# Patient Record
Sex: Male | Born: 1955 | Race: White | Hispanic: No | Marital: Married | Smoking: Never smoker
Health system: Southern US, Community
[De-identification: ages and names within clinical notes are randomized; demographics above are authoritative.]

## PROBLEM LIST (undated history)

## (undated) DIAGNOSIS — N200 Calculus of kidney: Secondary | ICD-10-CM

---

## 2009-11-04 ENCOUNTER — Ambulatory Visit: Payer: Self-pay | Admitting: Gastroenterology

## 2011-01-09 ENCOUNTER — Emergency Department: Payer: Self-pay | Admitting: *Deleted

## 2015-05-13 ENCOUNTER — Ambulatory Visit
Admission: EM | Admit: 2015-05-13 | Discharge: 2015-05-13 | Disposition: A | Discharge status: Home / Self Care | Payer: BLUE CROSS/BLUE SHIELD | Attending: Family Medicine | Admitting: Family Medicine

## 2015-05-13 DIAGNOSIS — J069 Acute upper respiratory infection, unspecified: Secondary | ICD-10-CM

## 2015-05-13 LAB — RAPID INFLUENZA A&B ANTIGENS (ARMC ONLY): INFLUENZA B (ARMC): NOT DETECTED

## 2015-05-13 LAB — RAPID INFLUENZA A&B ANTIGENS: Influenza A (ARMC): NOT DETECTED

## 2015-05-13 MED ORDER — GUAIFENESIN-CODEINE 100-10 MG/5ML PO SOLN: 10.0000 mL | Freq: Three times a day (TID) | ORAL | Status: DC | PRN

## 2015-05-13 NOTE — ED Notes (Signed)
Pt c/o dry hacking cough with generalized bodyaches since Monday.

## 2015-05-13 NOTE — ED Provider Notes (Signed)
[facility]  ____________________________________________  Time seen: Approximately 1:55 PM  I have reviewed the triage vital signs and the nursing notes.   HISTORY  Chief Complaint URI   HPI Jonathan Gould is a 60 y.o. male presents for complaint of runny nose, nasal congestion,sinus drainage, sinus pressure, dry cough. Patient reports the symptoms came on suddenly this past Monday. Patient reports feeling that he has had a fever at home intermittently but states did not measure with a thermometer; none today. 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 Denies other known sick contacts. Reports continues to eat and drink well. Reports cough often keeps him up at night.   Denies chest pain, shortness of breath, wheezing, weakness, abdominal pain, dysuria, neck or back pain, rash or recent sickness.   History reviewed. No pertinent past medical history.  There are no active problems to display for this patient.   History reviewed. No pertinent past surgical history.  Current Outpatient Rx  Name  Route  Sig  Dispense  Refill  . cetirizine (ZYRTEC) 10 MG tablet   Oral   Take 10 mg by mouth daily.           Allergies Review of patient's allergies indicates no known allergies.  No family history on file.  Social History Social History  Substance Use Topics  . Smoking status: Never Smoker   . Smokeless tobacco: Current User    Types: Chew  . Alcohol Use: No    Review of Systems Constitutional: Subjective fevers. Eyes: No visual changes. ENT: No sore throat. positive runny nose, nasal congestion, cough.  Cardiovascular: Denies chest pain. Respiratory: Denies shortness of breath. Gastrointestinal: No abdominal pain.  No nausea, no vomiting.  No diarrhea.  No constipation. Genitourinary: Negative for dysuria. Musculoskeletal: Negative for back pain. Skin: Negative for rash. Neurological: Negative for  headaches, focal weakness or numbness.  10-point ROS otherwise negative.  ____________________________________________   PHYSICAL EXAM:  VITAL SIGNS: ED Triage Vitals  Enc Vitals Group     BP 05/13/15 1322 136/72 mmHg     Pulse Rate 05/13/15 1322 102 Recheck 88     Resp 05/13/15 1322 19     Temp 05/13/15 1322 98.7 F (37.1 C)     Temp Source 05/13/15 1322 Oral     SpO2 05/13/15 1322 96 %     Weight 05/13/15 1322 205 lb (92.987 kg)     Height 05/13/15 1322  (1.905 m)     Head Cir --      Peak Flow --      Pain Score 05/13/15 1324 7     Pain Loc --      Pain Edu? --      Excl. in GC? --     Constitutional: Alert and oriented. Well appearing and in no acute distress. Eyes: Conjunctivae are normal. PERRL. EOMI. Head: Atraumatic.minimal tenderness to palpation bilateral frontal sinuses, nontender maxillary sinuses. No swelling. No erythema.   Ears: no erythema, normal TMs bilaterally.   Nose: nasal congestion with bilateral nasal turbinate erythema and edema. Clear rhinorrhea  Mouth/Throat: Mucous membranes are moist.  Oropharynx non-erythematous.No tonsillar swelling or exudate.  Neck: No stridor.  No cervical spine tenderness to palpation. Hematological/Lymphatic/Immunilogical: No cervical lymphadenopathy. Cardiovascular: Normal rate, regular rhythm. Grossly normal heart sounds.  Good peripheral circulation. Respiratory: Normal respiratory effort.  No retractions. Lungs CTAB. No wheezes, rales or rhonchi. Good air movement. Dry intermittent cough in room.  Gastrointestinal: Soft and nontender. No distention. Normal Bowel sounds. No CVA tenderness. Musculoskeletal: No lower or upper extremity tenderness nor edema.  Bilateral pedal pulses equal and easily palpated. No cervical, thoracic or lumbar tenderness to palpation.  Neurologic:  Normal speech and language. No gross focal neurologic deficits are appreciated. No gait instability. Skin:  Skin is warm, dry and intact. No  rash noted. Psychiatric: Mood and affect are normal. Speech and behavior are normal. ____________________________________________   LABS (all labs ordered are listed, but only abnormal results are displayed)  Labs Reviewed  RAPID INFLUENZA A&B ANTIGENS ([facility] ONLY)     INITIAL IMPRESSION / ASSESSMENT AND PLAN / ED COURSE  Pertinent labs & imaging results that were available during my care of the patient were reviewed by me and considered in my medical decision making (see chart for details).  Very well-appearing patient. No acute distress. Presents with complaints of 3 days of runny nose, nasal congestion, sinus drainage, sinus pressure, cough. Denies chest pain or shortness of breath. Reports continues to eat and drink well. Lungs clear throughout. Abdomen soft and nontender. Moist mucous membranes. Suspect viral upper respiratory infection/influenza. Will swab for influenza.   Influenza negative. Suspect viral upper respiratory infection. Will treat supportively and symptomatically. Encouraged rest, fluids, when necessary guaifenesin with codeine, over-the-counter Zyrtec as needed. Encouraged close PCP follow-up.   Discussed follow up with Primary care physician this week. Discussed follow up and return parameters including chest pain, shortness of breath, weakness, dizziness, fevers, abdominal pain, no resolution or any worsening concerns. Patient verbalized understanding and agreed to plan.   ____________________________________________   FINAL CLINICAL IMPRESSION(S) / ED DIAGNOSES  Final diagnoses:  Viral upper respiratory infection      Note: This dictation was prepared with Dragon dictation along with smaller phrase technology. Any transcriptional errors that result from this process are unintentional.    Renford Dills, NP 05/13/15 1526  Renford Dills, NP 05/13/15 1528

## 2015-05-13 NOTE — Discharge Instructions (Signed)
Take medication as prescribed. Rest. Eat and drink regularly.  Follow-up closely with your primary care physician this week. Return to urgent care proceed to ER for chest pain, shortness of breath, fevers, abdominal pain, new or worsening concerns.  Upper Respiratory Infection, Adult Most upper respiratory infections (URIs) are a viral infection of the air passages leading to the lungs. A URI affects the nose, throat, and upper air passages. The most common type of URI is nasopharyngitis and is typically referred to as "the common cold." URIs run their course and usually go away on their own. Most of the time, a URI does not require medical attention, but sometimes a bacterial infection in the upper airways can follow a viral infection. This is called a secondary infection. Sinus and middle ear infections are common types of secondary upper respiratory infections. Bacterial pneumonia can also complicate a URI. A URI can worsen asthma and chronic obstructive pulmonary disease (COPD). Sometimes, these complications can require emergency medical care and may be life threatening.  CAUSES Almost all URIs are caused by viruses. A virus is a type of germ and can spread from one person to another.  RISKS FACTORS You may be at risk for a URI if:   You smoke.   You have chronic heart or lung disease.  You have a weakened defense (immune) system.   You are very young or very old.   You have nasal allergies or asthma.  You work in crowded or poorly ventilated areas.  You work in health care facilities or schools. SIGNS AND SYMPTOMS  Symptoms typically develop 2-3 days after you come in contact with a cold virus. Most viral URIs last 7-10 days. However, viral URIs from the influenza virus (flu virus) can last 14-18 days and are typically more severe. Symptoms may include:   Runny or stuffy (congested) nose.   Sneezing.   Cough.   Sore throat.   Headache.   Fatigue.   Fever.    Loss of appetite.   Pain in your forehead, behind your eyes, and over your cheekbones (sinus pain).  Muscle aches.  DIAGNOSIS  Your health care provider may diagnose a URI by:  Physical exam.  Tests to check that your symptoms are not due to another condition such as:  Strep throat.  Sinusitis.  Pneumonia.  Asthma. TREATMENT  A URI goes away on its own with time. It cannot be cured with medicines, but medicines may be prescribed or recommended to relieve symptoms. Medicines may help:  Reduce your fever.  Reduce your cough.  Relieve nasal congestion. HOME CARE INSTRUCTIONS   Take medicines only as directed by your health care provider.   Gargle warm saltwater or take cough drops to comfort your throat as directed by your health care provider.  Use a warm mist humidifier or inhale steam from a shower to increase air moisture. This may make it easier to breathe.  Drink enough fluid to keep your urine clear or pale yellow.   Eat soups and other clear broths and maintain good nutrition.   Rest as needed.   Return to work when your temperature has returned to normal or as your health care provider advises. You may need to stay home longer to avoid infecting others. You can also use a face mask and careful hand washing to prevent spread of the virus.  Increase the usage of your inhaler if you have asthma.   Do not use any tobacco products, including cigarettes, chewing tobacco, or  electronic cigarettes. If you need help quitting, ask your health care provider. PREVENTION  The best way to protect yourself from getting a cold is to practice good hygiene.   Avoid oral or hand contact with people with cold symptoms.   Wash your hands often if contact occurs.  There is no clear evidence that vitamin C, vitamin E, echinacea, or exercise reduces the chance of developing a cold. However, it is always recommended to get plenty of rest, exercise, and practice good  nutrition.  SEEK MEDICAL CARE IF:   You are getting worse rather than better.   Your symptoms are not controlled by medicine.   You have chills.  You have worsening shortness of breath.  You have brown or red mucus.  You have yellow or brown nasal discharge.  You have pain in your face, especially when you bend forward.  You have a fever.  You have swollen neck glands.  You have pain while swallowing.  You have white areas in the back of your throat. SEEK IMMEDIATE MEDICAL CARE IF:   You have severe or persistent:  Headache.  Ear pain.  Sinus pain.  Chest pain.  You have chronic lung disease and any of the following:  Wheezing.  Prolonged cough.  Coughing up blood.  A change in your usual mucus.  You have a stiff neck.  You have changes in your:  Vision.  Hearing.  Thinking.  Mood. MAKE SURE YOU:   Understand these instructions.  Will watch your condition.  Will get help right away if you are not doing well or get worse.   This information is not intended to replace advice given to you by your health care provider. Make sure you discuss any questions you have with your health care provider.   Document Released: 09/27/2000 Document Revised: 08/18/2014 Document Reviewed: 07/09/2013 Elsevier Interactive Patient Education [employer].

## 2019-11-03 ENCOUNTER — Encounter: Payer: Self-pay | Admitting: Emergency Medicine

## 2019-11-03 ENCOUNTER — Other Ambulatory Visit: Payer: Self-pay

## 2019-11-03 ENCOUNTER — Ambulatory Visit
Admission: EM | Admit: 2019-11-03 | Discharge: 2019-11-03 | Disposition: A | Discharge status: Home / Self Care | Payer: BC Managed Care – PPO | Attending: Family Medicine | Admitting: Family Medicine

## 2019-11-03 DIAGNOSIS — R490 Dysphonia: Secondary | ICD-10-CM | POA: Diagnosis not present

## 2019-11-03 DIAGNOSIS — J988 Other specified respiratory disorders: Secondary | ICD-10-CM | POA: Diagnosis not present

## 2019-11-03 DIAGNOSIS — Z79899 Other long term (current) drug therapy: Secondary | ICD-10-CM | POA: Insufficient documentation

## 2019-11-03 DIAGNOSIS — J029 Acute pharyngitis, unspecified: Secondary | ICD-10-CM | POA: Diagnosis not present

## 2019-11-03 DIAGNOSIS — R5383 Other fatigue: Secondary | ICD-10-CM | POA: Insufficient documentation

## 2019-11-03 DIAGNOSIS — Z20822 Contact with and (suspected) exposure to covid-19: Secondary | ICD-10-CM | POA: Insufficient documentation

## 2019-11-03 DIAGNOSIS — R05 Cough: Secondary | ICD-10-CM | POA: Insufficient documentation

## 2019-11-03 MED ORDER — IPRATROPIUM BROMIDE 0.06 % NA SOLN: 2.0000 | Freq: Four times a day (QID) | NASAL | 0 refills | Status: DC | PRN

## 2019-11-03 MED ORDER — BENZONATATE 200 MG PO CAPS: 200.0000 mg | ORAL_CAPSULE | Freq: Three times a day (TID) | ORAL | 0 refills | Status: DC | PRN

## 2019-11-03 NOTE — ED Provider Notes (Signed)
[facility]    CSN: [id number] Arrival date & time: 11/03/19  1714      History   Chief Complaint Chief Complaint  Patient presents with   Cough   HPI  64 year old male presents with respiratory symptoms.  Patient reports that his symptoms started approximately 3 days ago.  Started after he came home from work.  No reported sick contacts.  Reports cough, congestion, sore throat, hoarseness, fatigue.  No documented fever.  Denies shortness of breath.  No GI symptoms.  He has not been vaccinated against COVID-19.  No relieving factors.  Denies pain at this time.  No other complaints.   Home Medications    Prior to Admission medications   Medication Sig Start Date End Date Taking? Authorizing Provider  cetirizine (ZYRTEC) 10 MG tablet Take 10 mg by mouth daily.   Yes [provider]  benzonatate (TESSALON) 200 MG capsule Take 1 capsule (200 mg total) by mouth 3 (three) times daily as needed for cough. 11/03/19   Tommie Sams, DO  guaiFENesin-codeine 100-10 MG/5ML syrup Take 10 mLs by mouth 3 (three) times daily as needed for cough (do not drive or operate machinery while taking as can cause drowsiness.). 05/13/15   Renford Dills, NP  ipratropium (ATROVENT) 0.06 % nasal spray Place 2 sprays into both nostrils 4 (four) times daily as needed for rhinitis. 11/03/19   Tommie Sams, DO   Social History Social History   Tobacco Use   Smoking status: Never Smoker   Smokeless tobacco: Current User    Types: Chew  Vaping Use   Vaping Use: Never used  Substance Use Topics   Alcohol use: No   Drug use: Not Currently     Allergies   Patient has no known allergies.   Review of Systems Review of Systems Per HPI  Physical Exam Triage Vital Signs ED Triage Vitals  Enc Vitals Group     BP 11/03/19 1745 136/90     Pulse Rate 11/03/19 1745 79     Resp 11/03/19 1745 18     Temp 11/03/19 1745 99.5 F (37.5 C)     Temp Source 11/03/19 1745 Oral      SpO2 11/03/19 1745 97 %     Weight 11/03/19 1744 205 lb 0.4 oz (93 kg)     Height 11/03/19 1744 6\' 3"  (1.905 m)     Head Circumference --      Peak Flow --      Pain Score 11/03/19 1744 0     Pain Loc --      Pain Edu? --      Excl. in GC? --    Updated Vital Signs BP 136/90 (BP Location: Left Arm)    Pulse 79    Temp 99.5 F (37.5 C) (Oral)    Resp 18    Ht 6\' 3"  (1.905 m)    Wt 93 kg    SpO2 97%    BMI 25.63 kg/m   Visual Acuity Right Eye Distance:   Left Eye Distance:   Bilateral Distance:    Right Eye Near:   Left Eye Near:    Bilateral Near:     Physical Exam Vitals and nursing note reviewed.  Constitutional:      General: He is not in acute distress.    Appearance: Normal appearance. He is not ill-appearing.  HENT:     Head: Normocephalic and atraumatic.     Right Ear: Tympanic membrane normal.  Left Ear: Tympanic membrane normal.     Mouth/Throat:     Pharynx: Oropharynx is clear. No oropharyngeal exudate.  Eyes:     General:        Right eye: No discharge.        Left eye: No discharge.     Conjunctiva/sclera: Conjunctivae normal.  Cardiovascular:     Rate and Rhythm: Normal rate and regular rhythm.     Heart sounds: No murmur heard.   Pulmonary:     Effort: Pulmonary effort is normal.     Breath sounds: No wheezing, rhonchi or rales.  Neurological:     Mental Status: He is alert.  Psychiatric:        Mood and Affect: Mood normal.        Behavior: Behavior normal.    UC Treatments / Results  Labs (all labs ordered are listed, but only abnormal results are displayed) Labs Reviewed  SARS CORONAVIRUS 2 (TAT 6-24 HRS)    EKG   Radiology No results found.  Procedures Procedures (including critical care time)  Medications Ordered in UC Medications - No data to display  Initial Impression / Assessment and Plan / UC Course  I have reviewed the triage vital signs and the nursing notes.  Pertinent labs & imaging results that were available  during my care of the patient were reviewed by me and considered in my medical decision making (see chart for details).    64 year old male presents with a respiratory infection.  Concern for COVID-19.  Tessalon Perles, Atrovent nasal spray for symptomatic treatment.  Awaiting Covid test results.  Final Clinical Impressions(s) / UC Diagnoses   Final diagnoses:  Respiratory infection     Discharge Instructions     Tylenol as needed for fever/body aches.  Medications as prescribed.  COVID test result should be back tomorrow.  Take care  Dr. Adriana Simas    ED Prescriptions    Medication Sig Dispense Auth. Provider   benzonatate (TESSALON) 200 MG capsule Take 1 capsule (200 mg total) by mouth 3 (three) times daily as needed for cough. 30 capsule Rihana Kiddy G, DO   ipratropium (ATROVENT) 0.06 % nasal spray Place 2 sprays into both nostrils 4 (four) times daily as needed for rhinitis. 15 mL Tommie Sams, DO     PDMP not reviewed this encounter.   Tommie Sams, [place] 11/03/19 2034

## 2019-11-03 NOTE — Discharge Instructions (Signed)
Tylenol as needed for fever/body aches.  Medications as prescribed.  COVID test result should be back tomorrow.  Take care  Dr. Adriana Simas

## 2019-11-03 NOTE — ED Triage Notes (Signed)
Pt c/o cough, sore throat, sinus congestion, hoarseness, and fatigue. Started about 3 days ago. Denies fever, shortness of breath or GI symptoms. Has not had covid vaccines.

## 2019-11-04 LAB — SARS CORONAVIRUS 2 (TAT 6-24 HRS): SARS Coronavirus 2: NEGATIVE

## 2020-07-16 DIAGNOSIS — Z98811 Dental restoration status: Secondary | ICD-10-CM

## 2020-07-16 HISTORY — DX: Dental restoration status: Z98.811

## 2020-07-24 ENCOUNTER — Encounter: Payer: Self-pay | Admitting: Intensive Care

## 2020-07-24 ENCOUNTER — Observation Stay: Payer: BC Managed Care – PPO | Admitting: Anesthesiology

## 2020-07-24 ENCOUNTER — Encounter
Admission: EM | Disposition: A | Discharge status: Home / Self Care | Payer: Self-pay | Source: Home / Self Care | Attending: Emergency Medicine

## 2020-07-24 ENCOUNTER — Emergency Department: Payer: BC Managed Care – PPO

## 2020-07-24 ENCOUNTER — Ambulatory Visit
Admission: EM | Admit: 2020-07-24 | Discharge: 2020-07-24 | Disposition: A | Discharge status: Home / Self Care | Payer: BC Managed Care – PPO | Source: Home / Self Care

## 2020-07-24 ENCOUNTER — Other Ambulatory Visit: Payer: Self-pay

## 2020-07-24 ENCOUNTER — Ambulatory Visit (INDEPENDENT_AMBULATORY_CARE_PROVIDER_SITE_OTHER): Payer: BC Managed Care – PPO

## 2020-07-24 ENCOUNTER — Observation Stay
Admission: EM | Admit: 2020-07-24 | Discharge: 2020-07-26 | Disposition: A | Discharge status: Home / Self Care | Payer: BC Managed Care – PPO | Attending: Urology | Admitting: Urology

## 2020-07-24 DIAGNOSIS — N12 Tubulo-interstitial nephritis, not specified as acute or chronic: Secondary | ICD-10-CM | POA: Insufficient documentation

## 2020-07-24 DIAGNOSIS — N132 Hydronephrosis with renal and ureteral calculous obstruction: Secondary | ICD-10-CM | POA: Diagnosis not present

## 2020-07-24 DIAGNOSIS — N1330 Unspecified hydronephrosis: Secondary | ICD-10-CM

## 2020-07-24 DIAGNOSIS — R509 Fever, unspecified: Secondary | ICD-10-CM

## 2020-07-24 DIAGNOSIS — N201 Calculus of ureter: Secondary | ICD-10-CM | POA: Diagnosis not present

## 2020-07-24 DIAGNOSIS — N10 Acute tubulo-interstitial nephritis: Secondary | ICD-10-CM | POA: Insufficient documentation

## 2020-07-24 DIAGNOSIS — Z20822 Contact with and (suspected) exposure to covid-19: Secondary | ICD-10-CM | POA: Diagnosis not present

## 2020-07-24 DIAGNOSIS — R1030 Lower abdominal pain, unspecified: Secondary | ICD-10-CM | POA: Diagnosis present

## 2020-07-24 DIAGNOSIS — N200 Calculus of kidney: Secondary | ICD-10-CM

## 2020-07-24 DIAGNOSIS — N135 Crossing vessel and stricture of ureter without hydronephrosis: Secondary | ICD-10-CM

## 2020-07-24 DIAGNOSIS — R109 Unspecified abdominal pain: Secondary | ICD-10-CM

## 2020-07-24 DIAGNOSIS — N179 Acute kidney failure, unspecified: Secondary | ICD-10-CM | POA: Insufficient documentation

## 2020-07-24 DIAGNOSIS — Z79899 Other long term (current) drug therapy: Secondary | ICD-10-CM | POA: Diagnosis not present

## 2020-07-24 HISTORY — PX: CYSTOSCOPY WITH RETROGRADE PYELOGRAM, URETEROSCOPY AND STENT PLACEMENT: SHX5789

## 2020-07-24 HISTORY — DX: Calculus of kidney: N20.0

## 2020-07-24 LAB — URINALYSIS, COMPLETE (UACMP) WITH MICROSCOPIC
Bilirubin Urine: NEGATIVE
Glucose, UA: NEGATIVE mg/dL
Ketones, ur: 40 mg/dL — AB
Nitrite: POSITIVE — AB
Protein, ur: NEGATIVE mg/dL
Specific Gravity, Urine: 1.015 (ref 1.005–1.030)
WBC, UA: >50 WBC/hpf (ref 0–5)
pH: 5.5 (ref 5.0–8.0)

## 2020-07-24 LAB — CBC WITH DIFFERENTIAL/PLATELET
Abs Immature Granulocytes: 0.04 10*3/uL (ref 0.00–0.07)
Basophils Absolute: 0 10*3/uL (ref 0.0–0.1)
Basophils Relative: 0 %
Eosinophils Absolute: 0 10*3/uL (ref 0.0–0.5)
Eosinophils Relative: 0 %
HCT: 42.1 % (ref 39.0–52.0)
Hemoglobin: 14.3 g/dL (ref 13.0–17.0)
Immature Granulocytes: 0 %
Lymphocytes Relative: 4 %
Lymphs Abs: 0.5 10*3/uL — ABNORMAL LOW (ref 0.7–4.0)
MCH: 30.9 pg (ref 26.0–34.0)
MCHC: 34 g/dL (ref 30.0–36.0)
MCV: 90.9 fL (ref 80.0–100.0)
Monocytes Absolute: 0.3 10*3/uL (ref 0.1–1.0)
Monocytes Relative: 3 %
Neutro Abs: 11 10*3/uL — ABNORMAL HIGH (ref 1.7–7.7)
Neutrophils Relative %: 93 %
Platelets: 188 10*3/uL (ref 150–400)
RBC: 4.63 MIL/uL (ref 4.22–5.81)
RDW: 13.9 % (ref 11.5–15.5)
WBC: 11.9 10*3/uL — ABNORMAL HIGH (ref 4.0–10.5)
nRBC: 0 % (ref 0.0–0.2)

## 2020-07-24 LAB — CBG MONITORING, ED: Glucose-Capillary: 86 mg/dL (ref 70–99)

## 2020-07-24 LAB — CBC
HCT: 40 % (ref 39.0–52.0)
Hemoglobin: 13.5 g/dL (ref 13.0–17.0)
MCH: 31 pg (ref 26.0–34.0)
MCHC: 33.8 g/dL (ref 30.0–36.0)
MCV: 92 fL (ref 80.0–100.0)
Platelets: 173 10*3/uL (ref 150–400)
RBC: 4.35 MIL/uL (ref 4.22–5.81)
RDW: 13.9 % (ref 11.5–15.5)
WBC: 14.4 10*3/uL — ABNORMAL HIGH (ref 4.0–10.5)
nRBC: 0 % (ref 0.0–0.2)

## 2020-07-24 LAB — RESP PANEL BY RT-PCR (FLU A&B, COVID) ARPGX2
Influenza A by PCR: NEGATIVE
Influenza B by PCR: NEGATIVE
SARS Coronavirus 2 by RT PCR: NEGATIVE

## 2020-07-24 LAB — COMPREHENSIVE METABOLIC PANEL
ALT: 26 U/L (ref 0–44)
AST: 30 U/L (ref 15–41)
Albumin: 4.1 g/dL (ref 3.5–5.0)
Alkaline Phosphatase: 72 U/L (ref 38–126)
Anion gap: 8 (ref 5–15)
BUN: 15 mg/dL (ref 8–23)
CO2: 24 mmol/L (ref 22–32)
Calcium: 9 mg/dL (ref 8.9–10.3)
Chloride: 102 mmol/L (ref 98–111)
Creatinine, Ser: 1.53 mg/dL — ABNORMAL HIGH (ref 0.61–1.24)
GFR, Estimated: 51 mL/min — ABNORMAL LOW (ref >60)
Glucose, Bld: 110 mg/dL — ABNORMAL HIGH (ref 70–99)
Potassium: 4.1 mmol/L (ref 3.5–5.1)
Sodium: 134 mmol/L — ABNORMAL LOW (ref 135–145)
Total Bilirubin: 1.2 mg/dL (ref 0.3–1.2)
Total Protein: 7.2 g/dL (ref 6.5–8.1)

## 2020-07-24 LAB — CREATININE, SERUM
Creatinine, Ser: 1.65 mg/dL — ABNORMAL HIGH (ref 0.61–1.24)
GFR, Estimated: 46 mL/min — ABNORMAL LOW (ref >60)

## 2020-07-24 SURGERY — CYSTOURETEROSCOPY, WITH RETROGRADE PYELOGRAM AND STENT INSERTION
Anesthesia: General | Laterality: Left

## 2020-07-24 MED ORDER — FENTANYL CITRATE (PF) 100 MCG/2ML IJ SOLN
INTRAMUSCULAR | Status: AC
  Filled 2020-07-24: qty 2

## 2020-07-24 MED ORDER — OXYCODONE HCL 5 MG PO TABS: 5.0000 mg | ORAL_TABLET | Freq: Once | ORAL | Status: DC | PRN

## 2020-07-24 MED ORDER — DEXAMETHASONE SODIUM PHOSPHATE 10 MG/ML IJ SOLN
INTRAMUSCULAR | Status: DC | PRN
  Administered 2020-07-24: 10 mg via INTRAVENOUS

## 2020-07-24 MED ORDER — SODIUM CHLORIDE 0.9 % IV SOLN: INTRAVENOUS | Status: DC

## 2020-07-24 MED ORDER — DEXAMETHASONE SODIUM PHOSPHATE 10 MG/ML IJ SOLN
INTRAMUSCULAR | Status: AC
  Filled 2020-07-24: qty 1

## 2020-07-24 MED ORDER — SODIUM CHLORIDE 0.9 % IV SOLN
2.0000 g | Freq: Once | INTRAVENOUS | Status: AC
  Administered 2020-07-24: 2 g via INTRAVENOUS
  Filled 2020-07-24: qty 20

## 2020-07-24 MED ORDER — BENZONATATE 100 MG PO CAPS
200.0000 mg | ORAL_CAPSULE | Freq: Three times a day (TID) | ORAL | Status: DC | PRN
  Filled 2020-07-24: qty 2

## 2020-07-24 MED ORDER — ONDANSETRON HCL 4 MG/2ML IJ SOLN
4.0000 mg | Freq: Once | INTRAMUSCULAR | Status: AC
  Administered 2020-07-24: 4 mg via INTRAVENOUS
  Filled 2020-07-24: qty 2

## 2020-07-24 MED ORDER — KETOROLAC TROMETHAMINE 30 MG/ML IJ SOLN
15.0000 mg | INTRAMUSCULAR | Status: AC
  Administered 2020-07-24: 15 mg via INTRAVENOUS
  Filled 2020-07-24: qty 1

## 2020-07-24 MED ORDER — FENTANYL CITRATE (PF) 100 MCG/2ML IJ SOLN
INTRAMUSCULAR | Status: DC | PRN
  Administered 2020-07-24 (×2): 50 ug via INTRAVENOUS

## 2020-07-24 MED ORDER — OXYCODONE HCL 5 MG/5ML PO SOLN: 5.0000 mg | Freq: Once | ORAL | Status: DC | PRN

## 2020-07-24 MED ORDER — LIDOCAINE HCL (CARDIAC) PF 100 MG/5ML IV SOSY
PREFILLED_SYRINGE | INTRAVENOUS | Status: DC | PRN
  Administered 2020-07-24: 100 mg via INTRAVENOUS

## 2020-07-24 MED ORDER — ONDANSETRON HCL 4 MG/2ML IJ SOLN
INTRAMUSCULAR | Status: AC
  Filled 2020-07-24: qty 2

## 2020-07-24 MED ORDER — ONDANSETRON HCL 4 MG/2ML IJ SOLN: 4.0000 mg | Freq: Once | INTRAMUSCULAR | Status: DC | PRN

## 2020-07-24 MED ORDER — ZOLPIDEM TARTRATE 5 MG PO TABS: 5.0000 mg | ORAL_TABLET | Freq: Every evening | ORAL | Status: DC | PRN

## 2020-07-24 MED ORDER — SODIUM CHLORIDE 0.9 % IV BOLUS
1000.0000 mL | Freq: Once | INTRAVENOUS | Status: AC
  Administered 2020-07-24: 1000 mL via INTRAVENOUS

## 2020-07-24 MED ORDER — DEXTROSE 5 % IV SOLN
5.0000 mg/kg | Freq: Once | INTRAVENOUS | Status: AC
  Administered 2020-07-24: 480 mg via INTRAVENOUS
  Filled 2020-07-24: qty 12

## 2020-07-24 MED ORDER — CIPROFLOXACIN HCL 500 MG PO TABS: 500.0000 mg | ORAL_TABLET | Freq: Two times a day (BID) | ORAL | 0 refills | Status: DC

## 2020-07-24 MED ORDER — SODIUM CHLORIDE 0.9 % IV SOLN
1.0000 g | INTRAVENOUS | Status: DC
  Administered 2020-07-25 – 2020-07-26 (×2): 1 g via INTRAVENOUS
  Filled 2020-07-24 (×2): qty 1

## 2020-07-24 MED ORDER — ACETAMINOPHEN 10 MG/ML IV SOLN
1000.0000 mg | Freq: Once | INTRAVENOUS | Status: DC | PRN
  Administered 2020-07-24: 1000 mg via INTRAVENOUS

## 2020-07-24 MED ORDER — OXYCODONE-ACETAMINOPHEN 5-325 MG PO TABS
1.0000 | ORAL_TABLET | ORAL | Status: DC | PRN
  Administered 2020-07-24 (×2): 1 via ORAL
  Administered 2020-07-25 (×2): 2 via ORAL
  Filled 2020-07-24: qty 1
  Filled 2020-07-24 (×2): qty 2
  Filled 2020-07-24: qty 1

## 2020-07-24 MED ORDER — PROPOFOL 10 MG/ML IV BOLUS
INTRAVENOUS | Status: DC | PRN
  Administered 2020-07-24: 160 mg via INTRAVENOUS
  Administered 2020-07-24: 40 mg via INTRAVENOUS

## 2020-07-24 MED ORDER — MIDAZOLAM HCL 2 MG/2ML IJ SOLN
INTRAMUSCULAR | Status: AC
  Filled 2020-07-24: qty 2

## 2020-07-24 MED ORDER — IPRATROPIUM BROMIDE 0.06 % NA SOLN
2.0000 | Freq: Four times a day (QID) | NASAL | Status: DC | PRN
  Filled 2020-07-24: qty 15

## 2020-07-24 MED ORDER — LACTATED RINGERS IV SOLN: INTRAVENOUS | Status: DC | PRN

## 2020-07-24 MED ORDER — DOCUSATE SODIUM 100 MG PO CAPS: 100.0000 mg | ORAL_CAPSULE | Freq: Every day | ORAL | 0 refills | Status: AC

## 2020-07-24 MED ORDER — ACETAMINOPHEN 325 MG PO TABS
650.0000 mg | ORAL_TABLET | ORAL | Status: DC | PRN
  Administered 2020-07-26: 650 mg via ORAL
  Filled 2020-07-24: qty 2

## 2020-07-24 MED ORDER — PROPOFOL 500 MG/50ML IV EMUL
INTRAVENOUS | Status: AC
  Filled 2020-07-24: qty 50

## 2020-07-24 MED ORDER — POLYETHYLENE GLYCOL 3350 17 G PO PACK
17.0000 g | PACK | Freq: Every day | ORAL | Status: DC | PRN
  Filled 2020-07-24: qty 1

## 2020-07-24 MED ORDER — ACETAMINOPHEN 10 MG/ML IV SOLN
INTRAVENOUS | Status: AC
  Filled 2020-07-24: qty 100

## 2020-07-24 MED ORDER — PHENYLEPHRINE HCL (PRESSORS) 10 MG/ML IV SOLN
INTRAVENOUS | Status: DC | PRN
  Administered 2020-07-24: 200 ug via INTRAVENOUS
  Administered 2020-07-24: 100 ug via INTRAVENOUS

## 2020-07-24 MED ORDER — DIPHENHYDRAMINE HCL 12.5 MG/5ML PO ELIX
12.5000 mg | ORAL_SOLUTION | Freq: Four times a day (QID) | ORAL | Status: DC | PRN
  Filled 2020-07-24: qty 10

## 2020-07-24 MED ORDER — DIPHENHYDRAMINE HCL 50 MG/ML IJ SOLN: 12.5000 mg | Freq: Four times a day (QID) | INTRAMUSCULAR | Status: DC | PRN

## 2020-07-24 MED ORDER — HEPARIN SODIUM (PORCINE) 5000 UNIT/ML IJ SOLN
5000.0000 [IU] | Freq: Three times a day (TID) | INTRAMUSCULAR | Status: DC
  Administered 2020-07-24 – 2020-07-26 (×5): 5000 [IU] via SUBCUTANEOUS
  Filled 2020-07-24 (×5): qty 1

## 2020-07-24 MED ORDER — SUCCINYLCHOLINE CHLORIDE 20 MG/ML IJ SOLN
INTRAMUSCULAR | Status: DC | PRN
  Administered 2020-07-24: 120 mg via INTRAVENOUS

## 2020-07-24 MED ORDER — HYDROMORPHONE HCL 1 MG/ML IJ SOLN: 0.5000 mg | INTRAMUSCULAR | Status: DC | PRN

## 2020-07-24 MED ORDER — LORATADINE 10 MG PO TABS
10.0000 mg | ORAL_TABLET | Freq: Every day | ORAL | Status: DC
  Administered 2020-07-25: 10 mg via ORAL
  Filled 2020-07-24 (×3): qty 1

## 2020-07-24 MED ORDER — OXYCODONE-ACETAMINOPHEN 5-325 MG PO TABS: 1.0000 | ORAL_TABLET | ORAL | 0 refills | Status: DC | PRN

## 2020-07-24 MED ORDER — GUAIFENESIN-CODEINE 100-10 MG/5ML PO SOLN: 10.0000 mL | Freq: Three times a day (TID) | ORAL | Status: DC | PRN

## 2020-07-24 MED ORDER — SUGAMMADEX SODIUM 200 MG/2ML IV SOLN
INTRAVENOUS | Status: DC | PRN
  Administered 2020-07-24: 400 mg via INTRAVENOUS

## 2020-07-24 MED ORDER — ONDANSETRON HCL 4 MG/2ML IJ SOLN
INTRAMUSCULAR | Status: DC | PRN
  Administered 2020-07-24: 4 mg via INTRAVENOUS

## 2020-07-24 MED ORDER — ROCURONIUM BROMIDE 100 MG/10ML IV SOLN
INTRAVENOUS | Status: DC | PRN
  Administered 2020-07-24: 15 mg via INTRAVENOUS

## 2020-07-24 MED ORDER — SODIUM CHLORIDE FLUSH 0.9 % IV SOLN
INTRAVENOUS | Status: AC
  Filled 2020-07-24: qty 3

## 2020-07-24 MED ORDER — ONDANSETRON HCL 4 MG/2ML IJ SOLN: 4.0000 mg | INTRAMUSCULAR | Status: DC | PRN

## 2020-07-24 MED ORDER — FENTANYL CITRATE (PF) 100 MCG/2ML IJ SOLN: 25.0000 ug | INTRAMUSCULAR | Status: DC | PRN

## 2020-07-24 SURGICAL SUPPLY — 19 items
BAG URO DRAIN 4000ML (MISCELLANEOUS) ×2 IMPLANT
EXTRACTOR STONE NITINOL NGAGE (UROLOGICAL SUPPLIES) IMPLANT
GLOVE SURG ENC TEXT LTX SZ7 (GLOVE) ×2 IMPLANT
GOWN STRL REUS W/ TWL LRG LVL3 (GOWN DISPOSABLE) ×1 IMPLANT
GOWN STRL REUS W/TWL LRG LVL3 (GOWN DISPOSABLE) ×1
GUIDEWIRE SENSOR ANG DUAL FLEX (WIRE) ×2 IMPLANT
GUIDEWIRE STR DUAL SENSOR (WIRE) ×4 IMPLANT
IV NS 1000ML (IV SOLUTION) ×1
IV NS 1000ML BAXH (IV SOLUTION) ×1 IMPLANT
KIT TURNOVER KIT A (KITS) ×2 IMPLANT
LASER FIB FLEXIVA PULSE ID 365 (Laser) IMPLANT
MANIFOLD NEPTUNE II (INSTRUMENTS) ×2 IMPLANT
PACK CYSTO (CUSTOM PROCEDURE TRAY) ×2 IMPLANT
SET CYSTO W/LG BORE CLAMP LF (SET/KITS/TRAYS/PACK) ×2 IMPLANT
SHEATH URETERAL 12FRX35CM (MISCELLANEOUS) IMPLANT
STENT URET 6FRX28 CONTOUR (STENTS) ×2 IMPLANT
TRACTIP FLEXIVA PULS ID 200XHI (Laser) IMPLANT
TRACTIP FLEXIVA PULSE ID 200 (Laser)
TUBING CONNECTING 10 (TUBING) ×2 IMPLANT

## 2020-07-24 NOTE — ED Triage Notes (Signed)
Pt c/o possible kidney stone. Pt reports left-sided flank pain, fever (103.8, did take tylenol), chills. Pt denies hematuria or other urinary problems. Pt does have hx of kidney stones in same kidney.

## 2020-07-24 NOTE — Transfer of Care (Signed)
Immediate Anesthesia Transfer of Care Note  Patient: Jonathan Gould  Procedure(s) Performed: CYSTOSCOPY WITH RETROGRADE PYELOGRAM, URETEROSCOPY AND STENT PLACEMENT (Left )  Patient Location: PACU  Anesthesia Type:General  Level of Consciousness: sedated  Airway & Oxygen Therapy: Patient Spontanous Breathing and Patient connected to face mask oxygen  Post-op Assessment: Report given to RN and Post -op Vital signs reviewed and stable  Post vital signs: Reviewed and stable  Last Vitals:  Vitals Value Taken Time  BP    Temp 36.7 C 07/24/20 2040  Pulse 100 07/24/20 2042  Resp 16 07/24/20 2042  SpO2 99 % 07/24/20 2042  Vitals shown include unvalidated device data.  Last Pain:  Vitals:   07/24/20 2040  TempSrc:   PainSc: 0-No pain         Complications: No complications documented.

## 2020-07-24 NOTE — Anesthesia Procedure Notes (Signed)
Procedure Name: Intubation Date/Time: 07/24/2020 8:03 PM Performed by: Justus Memory, CRNA Pre-anesthesia Checklist: Patient identified, Patient being monitored, Timeout performed, Emergency Drugs available and Suction available Patient Re-evaluated:Patient Re-evaluated prior to induction Oxygen Delivery Method: Circle system utilized Preoxygenation: Pre-oxygenation with 100% oxygen Induction Type: IV induction Ventilation: Mask ventilation without difficulty Laryngoscope Size: Mac, 3 and McGraph Grade View: Grade I Tube type: Oral Tube size: 7.0 mm Number of attempts: 1 Airway Equipment and Method: Stylet and Video-laryngoscopy Placement Confirmation: ETT inserted through vocal cords under direct vision,  positive ETCO2 and breath sounds checked- equal and bilateral Secured at: 21 cm Tube secured with: Tape Dental Injury: Teeth and Oropharynx as per pre-operative assessment  Difficulty Due To: Difficulty was anticipated and Difficult Airway- due to anterior larynx Future Recommendations: Recommend- induction with short-acting agent, and alternative techniques readily available

## 2020-07-24 NOTE — ED Provider Notes (Signed)
[facility]    CSN: [id number] Arrival date & time: 07/24/20  1323      History   Chief Complaint Chief Complaint  Patient presents with  . Flank Pain    left    HPI ALVIA TORY is a 65 y.o. male.   HPI   65 year old male here for evaluation of left flank pain, fever, and chills.  Patient reports that his symptoms started at 530 this morning.  He had a sudden onset of left flank pain that was initially a 10/10.  T-max fever was 103.8 which has improved with Tylenol taken at home.  Patient reports that he feels a throbbing sensation in his left flank and it has not traveled anywhere.  Patient states that his urine has been cloudy and has had some urinary urgency and frequency.  Patient denies sweats, blood in his urine, or abdominal pain.  Patient has had nausea and vomiting.  Patient currently rates his pain as a 4/10.  Past Medical History:  Diagnosis Date  . Kidney stones     There are no problems to display for this patient.   History reviewed. No pertinent surgical history.     Home Medications    Prior to Admission medications   Medication Sig Start Date End Date Taking? Authorizing Provider  benzonatate (TESSALON) 200 MG capsule Take 1 capsule (200 mg total) by mouth 3 (three) times daily as needed for cough. 11/03/19   Tommie Sams, DO  cetirizine (ZYRTEC) 10 MG tablet Take 10 mg by mouth daily.    [provider]  guaiFENesin-codeine 100-10 MG/5ML syrup Take 10 mLs by mouth 3 (three) times daily as needed for cough (do not drive or operate machinery while taking as can cause drowsiness.). 05/13/15   Renford Dills, NP  ipratropium (ATROVENT) 0.06 % nasal spray Place 2 sprays into both nostrils 4 (four) times daily as needed for rhinitis. 11/03/19   Tommie Sams, DO    Family History History reviewed. No pertinent family history.  Social History Social History   Tobacco Use  . Smoking status: Never Smoker  . Smokeless tobacco:  Current User    Types: Chew  Vaping Use  . Vaping Use: Never used  Substance Use Topics  . Alcohol use: No  . Drug use: Not Currently     Allergies   Patient has no known allergies.   Review of Systems Review of Systems  Constitutional: Positive for chills and fever. Negative for activity change and appetite change.  Gastrointestinal: Positive for nausea and vomiting. Negative for abdominal pain and diarrhea.  Genitourinary: Positive for flank pain, frequency and urgency. Negative for hematuria.  Skin: Negative for rash.  Hematological: Negative.   Psychiatric/Behavioral: Negative.      Physical Exam Triage Vital Signs ED Triage Vitals  Enc Vitals Group     BP 07/24/20 1346 120/60     Pulse Rate 07/24/20 1346 86     Resp 07/24/20 1346 18     Temp 07/24/20 1346 (!) 100.8 F (38.2 C)     Temp Source 07/24/20 1346 Oral     SpO2 07/24/20 1346 95 %     Weight 07/24/20 1344 212 lb (96.2 kg)     Height 07/24/20 1344 6\' 3"  (1.905 m)     Head Circumference --      Peak Flow --      Pain Score 07/24/20 1344 5     Pain Loc --  Pain Edu? --      Excl. in GC? --    No data found.  Updated Vital Signs BP 120/60 (BP Location: Left Arm)   Pulse 86   Temp (!) 100.8 F (38.2 C) (Oral)   Resp 18   Ht 6\' 3"  (1.905 m)   Wt 212 lb (96.2 kg)   SpO2 95%   BMI 26.50 kg/m   Visual Acuity Right Eye Distance:   Left Eye Distance:   Bilateral Distance:    Right Eye Near:   Left Eye Near:    Bilateral Near:     Physical Exam Vitals and nursing note reviewed.  Constitutional:      General: He is not in acute distress.    Appearance: Normal appearance. He is normal weight. He is not ill-appearing.  HENT:     Head: Normocephalic and atraumatic.  Cardiovascular:     Rate and Rhythm: Normal rate and regular rhythm.     Pulses: Normal pulses.     Heart sounds: Normal heart sounds. No murmur heard. No gallop.   Pulmonary:     Effort: Pulmonary effort is normal.      Breath sounds: Normal breath sounds. No wheezing, rhonchi or rales.  Abdominal:     Palpations: Abdomen is soft.     Tenderness: There is no right CVA tenderness or left CVA tenderness.  Skin:    General: Skin is warm and dry.     Capillary Refill: Capillary refill takes less than 2 seconds.     Findings: No erythema or rash.  Neurological:     General: No focal deficit present.     Mental Status: He is alert and oriented to person, place, and time.  Psychiatric:        Mood and Affect: Mood normal.        Behavior: Behavior normal.        Thought Content: Thought content normal.        Judgment: Judgment normal.      UC Treatments / Results  Labs (all labs ordered are listed, but only abnormal results are displayed) Labs Reviewed  URINALYSIS, COMPLETE (UACMP) WITH MICROSCOPIC - Abnormal; Notable for the following components:      Result Value   APPearance HAZY (*)    Hgb urine dipstick TRACE (*)    Ketones, ur 40 (*)    Nitrite POSITIVE (*)    Leukocytes,Ua MODERATE (*)    Bacteria, UA MANY (*)    All other components within normal limits  CBC WITH DIFFERENTIAL/PLATELET - Abnormal; Notable for the following components:   WBC 11.9 (*)    Neutro Abs 11.0 (*)    Lymphs Abs 0.5 (*)    All other components within normal limits  COMPREHENSIVE METABOLIC PANEL - Abnormal; Notable for the following components:   Sodium 134 (*)    Glucose, Bld 110 (*)    Creatinine, Ser 1.53 (*)    GFR, Estimated 51 (*)    All other components within normal limits  URINE CULTURE    EKG   Radiology DG Abdomen 1 View  Result Date: 07/24/2020 CLINICAL DATA:  Left-sided flank pain and fever. EXAM: ABDOMEN - 1 VIEW COMPARISON:  CT abdomen pelvis dated January 09, 2011. FINDINGS: 1.8 cm calculus slightly medial to the expected location of the left kidney and possibly near the UPJ. No additional renal calculi. Unchanged phleboliths in the left pelvis. Normal bowel gas pattern. No acute osseous  abnormality. IMPRESSION: 1. 1.8  cm left renal calculus. Electronically Signed   By: Obie Dredge M.D.   On: 07/24/2020 14:59    Procedures Procedures (including critical care time)  Medications Ordered in UC Medications - No data to display  Initial Impression / Assessment and Plan / UC Course  I have reviewed the triage vital signs and the nursing notes.  Pertinent labs & imaging results that were available during my care of the patient were reviewed by me and considered in my medical decision making (see chart for details).   Patient is a very pleasant 65 year old male here for evaluation of left flank pain with associated fever, chills, nausea, and vomiting.  Patient reports that his flank pain was sudden onset about 530 this morning and initially was 10 out of 10 but that is improved.  Patient has had some urinary urgency and frequency with some cloudy urination but denies any blood in his urine.  Patient has a distant history of kidney stones but has not had a kidney stone a number of years and is not currently followed by urology.  Patient's left flank pain is lower on his flank and does not radiate anywhere.  Patient denies any history of prostate issues.  Physical exam reveals a benign cardiopulmonary exam.  No CVA tenderness present to percussion of the back and abdomen is soft.  Will check CBC, CMP, UA, and obtain KUB to look for stone.  UA shows trace hemoglobin, 40 ketones, nitrite positive, moderate leukocytes, greater than 50 WBCs, 11-20 RBCs, many bacteria, and WBCs in clumps.  We will send urine for culture.  KUB independently evaluated by me.  Interpretation: Patient has a large left renal calculi present.  Otherwise patient is a nonobstructive bowel gas pattern.  Awaiting radiology overread.  CBC shows an elevated white count of 11.9 platelets are 188, H&H is 14.3 and 42.1.  CMP shows mild hyponatremia with a sodium of 134, AKI with a creatinine of 1.53.  No previous  chemistries available for comparison.  Radiology interpretation is that patient has a 1.18 cm stone at the UPJ.  Based on the obstructing ureteral calculus, elevated white blood cell count, AKI, and infected urine patient is to be evaluated in the hospital.  Patient has elected to go to [facility] emergency department.  Report called to Marylene Land, the charge nurse in the ER.  Final Clinical Impressions(s) / UC Diagnoses   Final diagnoses:  Pyelonephritis  Nephrolithiasis  Acute kidney injury [facility], [place])     Discharge Instructions     Go to the emergency department at [facility] to be further evaluated and begin treatment for your acute kidney injury, kidney infection, and renal stone.    ED Prescriptions    None     PDMP not reviewed this encounter.   Becky Augusta, NP 07/24/20 1515

## 2020-07-24 NOTE — Op Note (Signed)
Operative Note  Preoperative diagnosis:  1.  Left UPJ stone  Postoperative diagnosis: 1.  Left UPJ stone  Procedure(s): 1.  Cystoscopy 2. Left retrograde pyelogram with interpretation 3. Left ureteral stent placement 4. Fluoroscopy <1 hour with intraoperative interpretation  Surgeon: Jettie Pagan, MD  Assistants:  None  Anesthesia:  General  Complications:  None  EBL:  Minimal  Specimens: 1. Left renal pelvis urine culture  Drains/Catheters: 1.  Left 6Fr x 28cm ureteral stent  Intraoperative findings:   1. Cystoscopy demonstrated no suspicious lesions, masses, stones or other pathology. 2. Left retrograde pyelogram demonstrated mild left hydronephrosis. 3. Successful left ureteral stent placement with curl in the renal pelvis and bladder respectively.  Indication:  Jonathan Gould is a 65 y.o. male with a history of urolithiasis who presented with acute onset of left flank pain found to have a 1.8 cm left UPJ stone with sign of infection. After reviewing the management options for treatment, he elected to proceed with the above surgical procedure(s). We have discussed the potential benefits and risks of the procedure, side effects of the proposed treatment, the likelihood of the patient achieving the goals of the procedure, and any potential problems that might occur during the procedure or recuperation. Informed consent has been obtained.  Description of procedure: The patient was taken to the operating room and general anesthesia was induced.  The patient was placed in the dorsal lithotomy position, prepped and draped in the usual sterile fashion, and preoperative antibiotics were administered. A preoperative time-out was performed.   Cystourethroscopy was performed.  The patient's urethra was examined and demonstrated wide caliber bulbar urethral stenosis. There was some bilobar prostatic hypertrophy. The bladder was then systematically examined in its entirety. There was no  evidence for any bladder tumors, stones, or other mucosal pathology. He did have 3+ trabeculation. Scout fluoroscopy demonstrated a large approximately 2cm left renal pelvis stone.  Attention then turned to the left ureteral orifice. A 0.038 sensor wire was passed through the left orifice and over the wire a 5 Fr open ended catheter was inserted. I performed a left retrograde pyelogram demonstrating mild hydronephrosis. The wire was then passed up to the level of the renal pelvis. There was a hydronephrotic drip. Aspirate was obtained and sent off as left renal pelvis urine for culture. Omnipaque contrast was injected through the ureteral catheter and a retrograde pyelogram was performed with findings as dictated above. The wire was then replaced and the open ended catheter was removed.   A 6Fr x 28cm ureteral stent was advance over the wire. The stent was positioned appropriately under fluoroscopic and cystoscopic guidance.  The wire was then removed with an adequate stent curl noted in the renal pelvis as well as in the bladder. There was infectious debris seen draining from the stent after placement.  The bladder was then emptied, a 16 Fr foley was placed and the procedure ended.  The patient appeared to tolerate the procedure well and without complications.  The patient was able to be awakened and transferred to the recovery unit in satisfactory condition.   Plan: Patient will be observed overnight.  He will remain on broad-spectrum antibiotics.  He will remain in observation for 1-2 days with a course of antibiotics.  I have message schedulers to arrange for outpatient follow-up to treat his left renal pelvis stone.  Matt R. Kani Chauvin MD [facility]  Pager: [phone]

## 2020-07-24 NOTE — Discharge Instructions (Signed)
[facility] [phone] Post Cystoscopy/Stent Instructions  Definitions:  Ureter: The duct that transports urine from the kidney to the bladder. Stent:   A plastic hollow tube that is placed into the ureter, from the kidney to the bladder to prevent the ureter from swelling shut.  GENERAL INSTRUCTIONS:  Despite the fact that no skin incisions were used, the area around the ureter and bladder is raw and irritated. The stent is a foreign body which will further irritate the bladder wall. This irritation is manifested by increased frequency of urination, both day and night, and by an increase in the urge to urinate. In some, the urge to urinate is present almost always. Sometimes the urge is strong enough that you may not be able to stop yourself from urinating. The only real cure is to remove the stent and then give time for the bladder wall to heal which can't be done until the danger of the ureter swelling shut has passed, which varies.  You may see some blood in your urine while the stent is in place and a few days afterwards. Do not be alarmed, even if the urine was clear for a while. Get off your feet and drink lots of fluids until clearing occurs. If you start to pass clots or don't improve, call us.  DIET: You may return to your normal diet immediately. Because of the raw surface of your bladder, alcohol, spicy foods, acid type foods and drinks with caffeine may cause irritation or frequency and should be used in moderation. To keep your urine flowing freely and to avoid constipation, drink plenty of fluids during the day ( 8-10 glasses ). Tip: Avoid cranberry juice because it is very acidic.  ACTIVITY: Your physical activity doesn't need to be restricted. However, if you are very active, you may see some blood in your urine. We suggest that you reduce your activity under these circumstances until the bleeding has stopped.  BOWELS: It is important to keep your bowels  regular during the postoperative period. Straining with bowel movements can cause bleeding. A bowel movement every other day is reasonable. Use a mild laxative if needed, such as Milk of Magnesia 2-3 tablespoons, or 2 Dulcolax tablets. Call if you continue to have problems. If you have been taking narcotics for pain, before, during or after your surgery, you may be constipated. Take a laxative if necessary.   MEDICATION: You should resume your pre-surgery medications unless told not to. In addition you will often be given an antibiotic to prevent infection. These should be taken as prescribed until the bottles are finished unless you are having an unusual reaction to one of the drugs.  PROBLEMS YOU SHOULD REPORT TO [place]:  Fevers over 100.5 Fahrenheit.  Heavy bleeding, or clots ( See above notes about blood in urine ).  Inability to urinate.  Drug reactions ( hives, rash, nausea, vomiting, diarrhea ).  Severe burning or pain with urination that is not improving.  FOLLOW-UP: You will need a follow-up appointment to monitor your progress. Call for this appointment at the number listed above. Usually the first appointment will be about three to fourteen days after your surgery.

## 2020-07-24 NOTE — H&P (Signed)
H&P  Chief Complaint: Left flank pain  History of Present Illness: Jonathan Gould is a 65 y.o. year old with a past medical history of urolithiasis and underwent laser lithotripsy approximately 20 years ago presented to an urgent care earlier today with acute onset of left flank pain.  He has had a fever reportedly 103 at home.  He also developed nausea and emesis.  He presented to an urgent care where KUB demonstrated 1.8 cm stone in the left kidney.  He was then sent to the emergency department at [facility].  He was afebrile.  He had a leukocytosis of 11.9.  Urinalysis demonstrated positive nitrates, moderate leukocyte esterase.  Urine culture is pending.  CT A/P 07/24/2020 revealed a 1.6 cm stone at the left UPJ with mild left hydronephrosis.  He was also found to have additional punctate bilateral nephrolithiasis.  Past Medical History:  Diagnosis Date  . Kidney stones   . Kidney stones     History reviewed. No pertinent surgical history.  Home Medications:  No current facility-administered medications on file prior to encounter.   Current Outpatient Medications on File Prior to Encounter  Medication Sig Dispense Refill  . benzonatate (TESSALON) 200 MG capsule Take 1 capsule (200 mg total) by mouth 3 (three) times daily as needed for cough. 30 capsule 0  . cetirizine (ZYRTEC) 10 MG tablet Take 10 mg by mouth daily.    Marland Kitchen guaiFENesin-codeine 100-10 MG/5ML syrup Take 10 mLs by mouth 3 (three) times daily as needed for cough (do not drive or operate machinery while taking as can cause drowsiness.). 115 mL 0  . ipratropium (ATROVENT) 0.06 % nasal spray Place 2 sprays into both nostrils 4 (four) times daily as needed for rhinitis. 15 mL 0     Allergies: No Known Allergies  History reviewed. No pertinent family history.  Social History:  reports that he has never smoked. His smokeless tobacco use includes chew. He reports current alcohol use of about 7.0 standard drinks of alcohol per week. He  reports previous drug use.  ROS: A complete review of systems was performed.  All systems are negative except for pertinent findings as noted.  Physical Exam:  Vital signs in last 24 hours: Temp:  [98.3 F (36.8 C)-100.8 F (38.2 C)] 98.3 F (36.8 C) (04/09 1558) Pulse Rate:  [80-86] 80 (04/09 1830) Resp:  [16-18] 16 (04/09 1830) BP: (109-120)/(40-80) 116/52 (04/09 1830) SpO2:  [95 %-98 %] 96 % (04/09 1830) Weight:  [96.2 kg] 96.2 kg (04/09 1601) Constitutional:  Alert and oriented, No acute distress Cardiovascular: Regular rate and rhythm Respiratory: Normal respiratory effort, Lungs clear bilaterally GI: Abdomen is soft, nontender, nondistended, no abdominal masses GU: No CVA tenderness Lymphatic: No lymphadenopathy Neurologic: Grossly intact, no focal deficits Psychiatric: Normal mood and affect   Laboratory Data:  Recent Labs    07/24/20 1428  WBC 11.9*  HGB 14.3  HCT 42.1  PLT 188    Recent Labs    07/24/20 1428  NA 134*  K 4.1  CL 102  GLUCOSE 110*  BUN 15  CALCIUM 9.0  CREATININE 1.53*     Results for orders placed or performed during the hospital encounter of 07/24/20 (from the past 24 hour(s))  CBG monitoring, ED     Status: None   Collection Time: 07/24/20  4:10 PM  Result Value Ref Range   Glucose-Capillary 86 70 - 99 mg/dL  Resp Panel by RT-PCR (Flu A&B, Covid) Nasopharyngeal Swab  Status: None   Collection Time: 07/24/20  4:29 PM   Specimen: Nasopharyngeal Swab; Nasopharyngeal(NP) swabs in vial transport medium  Result Value Ref Range   SARS Coronavirus 2 by RT PCR NEGATIVE NEGATIVE   Influenza A by PCR NEGATIVE NEGATIVE   Influenza B by PCR NEGATIVE NEGATIVE   Recent Results (from the past 240 hour(s))  Resp Panel by RT-PCR (Flu A&B, Covid) Nasopharyngeal Swab     Status: None   Collection Time: 07/24/20  4:29 PM   Specimen: Nasopharyngeal Swab; Nasopharyngeal(NP) swabs in vial transport medium  Result Value Ref Range Status   SARS  Coronavirus 2 by RT PCR NEGATIVE NEGATIVE Final    Comment: (NOTE) SARS-CoV-2 target nucleic acids are NOT DETECTED.  The SARS-CoV-2 RNA is generally detectable in upper respiratory specimens during the acute phase of infection. The lowest concentration of SARS-CoV-2 viral copies this assay can detect is 138 copies/mL. A negative result does not preclude SARS-Cov-2 infection and should not be used as the sole basis for treatment or other patient management decisions. A negative result may occur with  improper specimen collection/handling, submission of specimen other than nasopharyngeal swab, presence of viral mutation(s) within the areas targeted by this assay, and inadequate number of viral copies(<138 copies/mL). A negative result must be combined with clinical observations, patient history, and epidemiological information. The expected result is Negative.  Fact Sheet for Patients:  BloggerCourse.com  Fact Sheet for Healthcare Providers:  SeriousBroker.it  This test is no t yet approved or cleared by the [place] FDA and  has been authorized for detection and/or diagnosis of SARS-CoV-2 by FDA under an Emergency Use Authorization (EUA). This EUA will remain  in effect (meaning this test can be used) for the duration of the COVID-19 declaration under Section 564(b)(1) of the Act, 21 U.S.C.section 360bbb-3(b)(1), unless the authorization is terminated  or revoked sooner.       Influenza A by PCR NEGATIVE NEGATIVE Final   Influenza B by PCR NEGATIVE NEGATIVE Final    Comment: (NOTE) The Xpert Xpress SARS-CoV-2/FLU/RSV plus assay is intended as an aid in the diagnosis of influenza from Nasopharyngeal swab specimens and should not be used as a sole basis for treatment. Nasal washings and aspirates are unacceptable for Xpert Xpress SARS-CoV-2/FLU/RSV testing.  Fact Sheet for  Patients: BloggerCourse.com  Fact Sheet for Healthcare Providers: SeriousBroker.it  This test is not yet approved or cleared by the [place] FDA and has been authorized for detection and/or diagnosis of SARS-CoV-2 by FDA under an Emergency Use Authorization (EUA). This EUA will remain in effect (meaning this test can be used) for the duration of the COVID-19 declaration under Section 564(b)(1) of the Act, 21 U.S.C. section 360bbb-3(b)(1), unless the authorization is terminated or revoked.  Performed at [facility], [address] [place], [place] [address]     Renal Function: Recent Labs    07/24/20 1428  CREATININE 1.53*   Estimated Creatinine Clearance: 59.1 mL/min (A) (by C-G formula based on SCr of 1.53 mg/dL (H)).  Radiologic Imaging: DG Abdomen 1 View  Result Date: 07/24/2020 CLINICAL DATA:  Left-sided flank pain and fever. EXAM: ABDOMEN - 1 VIEW COMPARISON:  CT abdomen pelvis dated January 09, 2011. FINDINGS: 1.8 cm calculus slightly medial to the expected location of the left kidney and possibly near the UPJ. No additional renal calculi. Unchanged phleboliths in the left pelvis. Normal bowel gas pattern. No acute osseous abnormality. IMPRESSION: 1. 1.8 cm left renal calculus. Electronically Signed  By: Obie Dredge M.D.   On: 07/24/2020 14:59   DG OR UROLOGY CYSTO IMAGE ([facility] ONLY)  Result Date: 07/24/2020 There is no interpretation for this exam.  This order is for images obtained during a surgical procedure.  Please See "Surgeries" Tab for more information regarding the procedure.   CT Renal Stone Study  Result Date: 07/24/2020 CLINICAL DATA:  Left-sided flank pain and fever. EXAM: CT ABDOMEN AND PELVIS WITHOUT CONTRAST TECHNIQUE: Multidetector CT imaging of the abdomen and pelvis was performed following the standard protocol without IV contrast. COMPARISON:  Abdominal x-ray from same day. CT abdomen  pelvis dated January 09, 2011. FINDINGS: Lower chest: Mild bibasilar atelectasis/scarring. Hepatobiliary: No focal liver abnormality is seen. No gallstones, gallbladder wall thickening, or biliary dilatation. Pancreas: Unremarkable. No pancreatic ductal dilatation or surrounding inflammatory changes. Spleen: Normal in size without focal abnormality. Adrenals/Urinary Tract: Adrenal glands are unremarkable. Large 1.6 cm calculus at the left UPJ with resultant mild left hydronephrosis. There are two additional punctate calculi in the lower pole of the left kidney. Punctate calculus in the upper pole of the right kidney. Left perinephric fat stranding. The bladder is unremarkable. Stomach/Bowel: Small hiatal hernia. The stomach is otherwise within normal limits. No bowel wall thickening, distention, or surrounding inflammatory changes. Mild to moderate left-sided colonic diverticulosis. Normal appendix. Vascular/Lymphatic: Aortic atherosclerosis. No enlarged abdominal or pelvic lymph nodes. Reproductive: Prostate is unremarkable. Other: Trace free fluid in the pelvis. Slightly increased small fat containing left inguinal hernia. No pneumoperitoneum. Musculoskeletal: No acute or significant osseous findings. IMPRESSION: 1. Large 1.6 cm calculus at the left UPJ with resultant mild left hydronephrosis. 2. Additional punctate bilateral nephrolithiasis. 3. Aortic Atherosclerosis (ICD10-I70.0). Electronically Signed   By: Obie Dredge M.D.   On: 07/24/2020 16:55    Impression/Assessment:  1. Left UPJ stone with obstruction and signs of infection: CT A/P 07/25/2018 with 1.6 cm stone in the left UPJ with mild left hydronephrosis.  Febrile at home at 103.  Afebrile in the emergency department.  WBC 11.9, creatinine 1.5, urinalysis positive nitrates, moderate leukocyte esterase.  Urine culture pending.  -To OR for cystoscopy, left retrograde pyelogram, left ureteral stent placement.  We discussed that if symptoms  impacted and unable to bypass the stone, may require left nephrostomy tube placement. -Continue broad-spectrum antibiotics.  Will need outpatient course of antibiotics for approximate 2 weeks. -We will need to follow with [facility] urology for definitive treatment of the stone. --The risks, benefits and alternatives of cystoscopy with left JJ stent placement was discussed with the patient.  Risks include, but are not limited to: bleeding, urinary tract infection, ureteral injury, ureteral stricture disease, chronic pain, urinary symptoms, bladder injury, stent migration, the need for nephrostomy tube placement, MI, CVA, DVT, PE and the inherent risks with general anesthesia.  The patient voices understanding and wishes to proceed.   Matt R. Hydeia Mcatee MD 07/24/2020, 7:29 PM  [facility]  Pager: [phone]

## 2020-07-24 NOTE — ED Notes (Signed)
Patient is being discharged from the Urgent Care and sent to the Emergency Department via private vehicle . Per Becky Augusta, NP, patient is in need of higher level of care due to kidney stone. Patient is aware and verbalizes understanding of plan of care.  Vitals:   07/24/20 1346  BP: 120/60  Pulse: 86  Resp: 18  Temp: (!) 100.8 F (38.2 C)  SpO2: 95%

## 2020-07-24 NOTE — ED Triage Notes (Signed)
C/o left flank pain with N/V with fever that started this AM.  Last had tylenol 1pm.

## 2020-07-24 NOTE — Discharge Instructions (Addendum)
Go to the emergency department at [facility] to be further evaluated and begin treatment for your acute kidney injury, kidney infection, and renal stone.

## 2020-07-24 NOTE — ED Provider Notes (Signed)
[facility] Emergency Department Provider Note  ____________________________________________  Time seen: Approximately 4:28 PM  I have reviewed the triage vital signs and the nursing notes.   HISTORY  Chief Complaint Flank Pain    HPI Jonathan Gould is a 65 y.o. male with a past history of kidney stones who is sent to the ED from urgent care with concern for UTI plus obstructing stone.  Patient reports he was in his usual state of health until this morning when he felt febrile, had severe left flank pain that is waxing and waning, 10/10 in intensity its worst, nonradiating, no aggravating or alleviating factors.  Also had a fever of 103 at home.  Associated with nausea and vomiting x2.  Took Tylenol at 1 PM, and then went to urgent care.  At urgent care, urinalysis shows nitrite positive UTI.  KUB shows 1.8 cm stone at the left UPJ.  Creatinine 1.5 with no prior baseline.  Currently patient states pain is 4/10.  Feels better than he did earlier.  Does not have a history of requiring ureteroscopy for stone retrieval.   Has not had anything to eat or drink since last night.  Takes no medications, no blood thinners.  No known medication allergies.   Past Medical History:  Diagnosis Date  . Kidney stones   . Kidney stones      There are no problems to display for this patient.    History reviewed. No pertinent surgical history.   Prior to Admission medications   Medication Sig Start Date End Date Taking? Authorizing Provider  benzonatate (TESSALON) 200 MG capsule Take 1 capsule (200 mg total) by mouth 3 (three) times daily as needed for cough. 11/03/19   Tommie Sams, DO  cetirizine (ZYRTEC) 10 MG tablet Take 10 mg by mouth daily.    [provider]  guaiFENesin-codeine 100-10 MG/5ML syrup Take 10 mLs by mouth 3 (three) times daily as needed for cough (do not drive or operate machinery while taking as can cause drowsiness.). 05/13/15   Renford Dills, NP  ipratropium (ATROVENT) 0.06 % nasal spray Place 2 sprays into both nostrils 4 (four) times daily as needed for rhinitis. 11/03/19   Tommie Sams, DO     Allergies Patient has no known allergies.   History reviewed. No pertinent family history.  Social History Social History   Tobacco Use  . Smoking status: Never Smoker  . Smokeless tobacco: Current User    Types: Chew  Vaping Use  . Vaping Use: Never used  Substance Use Topics  . Alcohol use: Yes    Alcohol/week: 7.0 standard drinks    Types: 7 Cans of beer per week  . Drug use: Not Currently    Review of Systems  Constitutional: Positive fever and chills.  ENT:   No sore throat. No rhinorrhea. Cardiovascular:   No chest pain or syncope. Respiratory:   No dyspnea or cough. Gastrointestinal:   Positive left flank pain and vomiting Musculoskeletal:   Negative for focal pain or swelling All other systems reviewed and are negative except as documented above in ROS and HPI.  ____________________________________________   PHYSICAL EXAM:  VITAL SIGNS: ED Triage Vitals  Enc Vitals Group     BP 07/24/20 1558 111/80     Pulse Rate 07/24/20 1558 86     Resp 07/24/20 1558 18     Temp 07/24/20 1558 98.3 F (36.8 C)     Temp Source 07/24/20 1558 Oral  SpO2 07/24/20 1558 97 %     Weight 07/24/20 1601 212 lb (96.2 kg)     Height 07/24/20 1601 6\' 3"  (1.905 m)     Head Circumference --      Peak Flow --      Pain Score 07/24/20 1601 10     Pain Loc --      Pain Edu? --      Excl. in GC? --     Vital signs reviewed, nursing assessments reviewed.   Constitutional:   Alert and oriented. Non-toxic appearance. Eyes:   Conjunctivae are normal. EOMI. PERRL. ENT      Head:   Normocephalic and atraumatic.      Nose:   Wearing a mask.      Mouth/Throat:   Wearing a mask.      Neck:   No meningismus. Full ROM. Hematological/Lymphatic/Immunilogical:   No cervical lymphadenopathy. Cardiovascular:   RRR.  Symmetric bilateral radial and DP pulses.  No murmurs. Cap refill less than 2 seconds. Respiratory:   Normal respiratory effort without tachypnea/retractions. Breath sounds are clear and equal bilaterally. No wheezes/rales/rhonchi. Gastrointestinal:   Soft and nontender. Non distended.   No rebound, rigidity, or guarding.  Musculoskeletal:   Normal range of motion in all extremities. No joint effusions.  No lower extremity tenderness.  No edema. Neurologic:   Normal speech and language.  Motor grossly intact. No acute focal neurologic deficits are appreciated.  Skin:    Skin is warm, dry and intact. No rash noted.  No petechiae, purpura, or bullae.  ____________________________________________    LABS (pertinent positives/negatives) (all labs ordered are listed, but only abnormal results are displayed) Labs Reviewed  RESP PANEL BY RT-PCR (FLU A&B, COVID) [id number]  CBG MONITORING, ED   ____________________________________________   EKG    ____________________________________________    RADIOLOGY  DG Abdomen 1 View  Result Date: 07/24/2020 CLINICAL DATA:  Left-sided flank pain and fever. EXAM: ABDOMEN - 1 VIEW COMPARISON:  CT abdomen pelvis dated January 09, 2011. FINDINGS: 1.8 cm calculus slightly medial to the expected location of the left kidney and possibly near the UPJ. No additional renal calculi. Unchanged phleboliths in the left pelvis. Normal bowel gas pattern. No acute osseous abnormality. IMPRESSION: 1. 1.8 cm left renal calculus. Electronically Signed   By: January 11, 2011 M.D.   On: 07/24/2020 14:59    ____________________________________________   PROCEDURES Procedures  ____________________________________________    CLINICAL IMPRESSION / ASSESSMENT AND PLAN / ED COURSE  Medications ordered in the ED: Medications  sodium chloride 0.9 % bolus 1,000 mL (has no administration in time range)  ondansetron (ZOFRAN) injection 4 mg (has no administration in time  range)  ketorolac (TORADOL) 30 MG/ML injection 15 mg (has no administration in time range)  cefTRIAXone (ROCEPHIN) 2 g in sodium chloride 0.9 % 100 mL IVPB (has no administration in time range)    Pertinent labs & imaging results that were available during my care of the patient were reviewed by me and considered in my medical decision making (see chart for details).  Jonathan Gould was evaluated in Emergency Department on 07/24/2020 for the symptoms described in the history of present illness. He was evaluated in the context of the global COVID-19 pandemic, which necessitated consideration that the patient might be at risk for infection with the SARS-CoV-2 virus that causes COVID-19. Institutional protocols and algorithms that pertain to the evaluation of patients at risk for COVID-19 are in a state of rapid change based  on information released by regulatory bodies including the CDC and federal and state organizations. These policies and algorithms were followed during the patient's care in the ED.   Patient sent to ED from urgent care with diagnosis of obstructing kidney stone with UTI.  With high fever, concern for developing pyelonephritis.  In the ED, vital signs are normal, he is not septic.  Nontoxic.  Exam is reassuring.    Case discussed with urology who recommends obtaining noncontrast CT scan, will plan to take to the OR for stenting.  Urine culture ordered by urgent care.  Will give a dose of Rocephin, IV fluids, Toradol, Zofran.  Covid screening swab ordered.      ____________________________________________   FINAL CLINICAL IMPRESSION(S) / ED DIAGNOSES    Final diagnoses:  Obstruction of left ureter  Pyelonephritis     ED Discharge Orders    None      Portions of this note were generated with dragon dictation software. Dictation errors may occur despite best attempts at proofreading.   Sharman Cheek, MD 07/24/20 [phone]

## 2020-07-24 NOTE — Anesthesia Preprocedure Evaluation (Signed)
Anesthesia Evaluation  Patient identified by MRN, date of birth, ID band Patient awake  General Assessment Comment:Pt with kidney stone. In pain, had nausea and vomiting this morning  Reviewed: Allergy & Precautions, NPO status , Patient's Chart, lab work & pertinent test results  History of Anesthesia Complications Negative for: history of anesthetic complications  Airway Mallampati: II  TM Distance: >3 FB Neck ROM: Full    Dental  (+) Poor Dentition   Pulmonary neg pulmonary ROS, neg sleep apnea, neg COPD, Patient abstained from smoking.Not current smoker,    Pulmonary exam normal breath sounds clear to auscultation       Cardiovascular Exercise Tolerance: Good METS(-) hypertension(-) CAD and (-) Past MI negative cardio ROS  (-) dysrhythmias  Rhythm:Regular Rate:Normal - Systolic murmurs    Neuro/Psych negative neurological ROS  negative psych ROS   GI/Hepatic neg GERD  ,(+)     (-) substance abuse  ,   Endo/Other  neg diabetes  Renal/GU negative Renal ROS     Musculoskeletal   Abdominal   Peds  Hematology   Anesthesia Other Findings Past Medical History: No date: Kidney stones No date: Kidney stones  Reproductive/Obstetrics                             Anesthesia Physical  Anesthesia Plan  ASA: II  Anesthesia Plan: General   Post-op Pain Management:    Induction: Intravenous and Rapid sequence  PONV Risk Score and Plan: 3 and Ondansetron and Dexamethasone  Airway Management Planned: Oral ETT and Video Laryngoscope Planned  Additional Equipment: None  Intra-op Plan:   Post-operative Plan: Extubation in OR  Informed Consent: I have reviewed the patients History and Physical, chart, labs and discussed the procedure including the risks, benefits and alternatives for the proposed anesthesia with the patient or authorized representative who has indicated his/her  understanding and acceptance.     Dental advisory given  Plan Discussed with: CRNA and Surgeon  Anesthesia Plan Comments: (Discussed risks of anesthesia with patient, including PONV, sore throat, lip/dental damage. Rare risks discussed as well, such as cardiorespiratory and neurological sequelae. Patient understands.)        Anesthesia Quick Evaluation  

## 2020-07-25 ENCOUNTER — Encounter: Payer: Self-pay | Admitting: Urology

## 2020-07-25 DIAGNOSIS — N390 Urinary tract infection, site not specified: Secondary | ICD-10-CM

## 2020-07-25 DIAGNOSIS — N200 Calculus of kidney: Secondary | ICD-10-CM | POA: Diagnosis not present

## 2020-07-25 DIAGNOSIS — Z960 Presence of urogenital implants: Secondary | ICD-10-CM

## 2020-07-25 DIAGNOSIS — N179 Acute kidney failure, unspecified: Secondary | ICD-10-CM | POA: Diagnosis not present

## 2020-07-25 LAB — BASIC METABOLIC PANEL
Anion gap: 7 (ref 5–15)
Anion gap: 6 (ref 5–15)
BUN: 18 mg/dL (ref 8–23)
BUN: 22 mg/dL (ref 8–23)
CO2: 23 mmol/L (ref 22–32)
CO2: 23 mmol/L (ref 22–32)
Calcium: 8.5 mg/dL — ABNORMAL LOW (ref 8.9–10.3)
Calcium: 8.1 mg/dL — ABNORMAL LOW (ref 8.9–10.3)
Chloride: 108 mmol/L (ref 98–111)
Chloride: 106 mmol/L (ref 98–111)
Creatinine, Ser: 1.9 mg/dL — ABNORMAL HIGH (ref 0.61–1.24)
Creatinine, Ser: 1.7 mg/dL — ABNORMAL HIGH (ref 0.61–1.24)
GFR, Estimated: 39 mL/min — ABNORMAL LOW (ref >60)
GFR, Estimated: 45 mL/min — ABNORMAL LOW (ref >60)
Glucose, Bld: 218 mg/dL — ABNORMAL HIGH (ref 70–99)
Glucose, Bld: 214 mg/dL — ABNORMAL HIGH (ref 70–99)
Potassium: 4.7 mmol/L (ref 3.5–5.1)
Potassium: 4.5 mmol/L (ref 3.5–5.1)
Sodium: 138 mmol/L (ref 135–145)
Sodium: 135 mmol/L (ref 135–145)

## 2020-07-25 LAB — GRAM STAIN

## 2020-07-25 LAB — CBC
HCT: 38.5 % — ABNORMAL LOW (ref 39.0–52.0)
Hemoglobin: 12.6 g/dL — ABNORMAL LOW (ref 13.0–17.0)
MCH: 30.9 pg (ref 26.0–34.0)
MCHC: 32.7 g/dL (ref 30.0–36.0)
MCV: 94.4 fL (ref 80.0–100.0)
Platelets: 176 10*3/uL (ref 150–400)
RBC: 4.08 MIL/uL — ABNORMAL LOW (ref 4.22–5.81)
RDW: 14.1 % (ref 11.5–15.5)
WBC: 16.4 10*3/uL — ABNORMAL HIGH (ref 4.0–10.5)
nRBC: 0 % (ref 0.0–0.2)

## 2020-07-25 LAB — HIV ANTIBODY (ROUTINE TESTING W REFLEX): HIV Screen 4th Generation wRfx: NONREACTIVE

## 2020-07-25 MED ORDER — SODIUM CHLORIDE 0.9 % IV SOLN: INTRAVENOUS | Status: DC

## 2020-07-25 MED ORDER — CHLORHEXIDINE GLUCONATE CLOTH 2 % EX PADS
6.0000 | MEDICATED_PAD | Freq: Every day | CUTANEOUS | Status: DC
  Administered 2020-07-25: 6 via TOPICAL

## 2020-07-25 MED ORDER — LACTATED RINGERS IV BOLUS
1000.0000 mL | Freq: Once | INTRAVENOUS | Status: AC
  Administered 2020-07-25: 1000 mL via INTRAVENOUS

## 2020-07-25 NOTE — Plan of Care (Signed)

## 2020-07-25 NOTE — Progress Notes (Signed)
1 Day Post-Op Subjective: Pain controlled. No nausea or emesis. Afebrile. C/o bladder spasms.  Objective: Vital signs in last 24 hours: Temp:  [97.3 F (36.3 C)-100.5 F (38.1 C)] 98.6 F (37 C) (04/10 1159) Pulse Rate:  [56-103] 66 (04/10 1159) Resp:  [16-21] 18 (04/10 1159) BP: (97-133)/(40-80) 112/57 (04/10 1159) SpO2:  [93 %-99 %] 96 % (04/10 1159) Weight:  [96.2 kg] 96.2 kg (04/09 1601)  Intake/Output from previous day: 04/09 0701 - 04/10 0700 In: 2162 [I.V.:1000; IV Piggyback:1162] Out: 1075 [Urine:1075] Intake/Output this shift: Total I/O In: 240 [P.O.:240] Out: -    UOP: 1L clear yellow  Physical Exam:  General: Alert and oriented CV: RRR Lungs: Clear Abdomen: Soft, ND, NT Ext: NT, No erythema  Lab Results: Recent Labs    07/24/20 1428 07/24/20 2214 07/25/20 0418  HGB 14.3 13.5 12.6*  HCT 42.1 40.0 38.5*   BMET Recent Labs    07/25/20 0418 07/25/20 0936  NA 138 135  K 4.7 4.5  CL 108 106  CO2 23 23  GLUCOSE 218* 214*  BUN 18 22  CREATININE 1.90* 1.70*  CALCIUM 8.5* 8.1*     Studies/Results: DG Abdomen 1 View  Result Date: 07/24/2020 CLINICAL DATA:  Left-sided flank pain and fever. EXAM: ABDOMEN - 1 VIEW COMPARISON:  CT abdomen pelvis dated January 09, 2011. FINDINGS: 1.8 cm calculus slightly medial to the expected location of the left kidney and possibly near the UPJ. No additional renal calculi. Unchanged phleboliths in the left pelvis. Normal bowel gas pattern. No acute osseous abnormality. IMPRESSION: 1. 1.8 cm left renal calculus. Electronically Signed   By: Obie Dredge M.D.   On: 07/24/2020 14:59   DG OR UROLOGY CYSTO IMAGE ([facility] ONLY)  Result Date: 07/24/2020 There is no interpretation for this exam.  This order is for images obtained during a surgical procedure.  Please See "Surgeries" Tab for more information regarding the procedure.   CT Renal Stone Study  Result Date: 07/24/2020 CLINICAL DATA:  Left-sided flank pain and fever.  EXAM: CT ABDOMEN AND PELVIS WITHOUT CONTRAST TECHNIQUE: Multidetector CT imaging of the abdomen and pelvis was performed following the standard protocol without IV contrast. COMPARISON:  Abdominal x-ray from same day. CT abdomen pelvis dated January 09, 2011. FINDINGS: Lower chest: Mild bibasilar atelectasis/scarring. Hepatobiliary: No focal liver abnormality is seen. No gallstones, gallbladder wall thickening, or biliary dilatation. Pancreas: Unremarkable. No pancreatic ductal dilatation or surrounding inflammatory changes. Spleen: Normal in size without focal abnormality. Adrenals/Urinary Tract: Adrenal glands are unremarkable. Large 1.6 cm calculus at the left UPJ with resultant mild left hydronephrosis. There are two additional punctate calculi in the lower pole of the left kidney. Punctate calculus in the upper pole of the right kidney. Left perinephric fat stranding. The bladder is unremarkable. Stomach/Bowel: Small hiatal hernia. The stomach is otherwise within normal limits. No bowel wall thickening, distention, or surrounding inflammatory changes. Mild to moderate left-sided colonic diverticulosis. Normal appendix. Vascular/Lymphatic: Aortic atherosclerosis. No enlarged abdominal or pelvic lymph nodes. Reproductive: Prostate is unremarkable. Other: Trace free fluid in the pelvis. Slightly increased small fat containing left inguinal hernia. No pneumoperitoneum. Musculoskeletal: No acute or significant osseous findings. IMPRESSION: 1. Large 1.6 cm calculus at the left UPJ with resultant mild left hydronephrosis. 2. Additional punctate bilateral nephrolithiasis. 3. Aortic Atherosclerosis (ICD10-I70.0). Electronically Signed   By: Obie Dredge M.D.   On: 07/24/2020 16:55    Assessment/Plan: 1. 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  Febrile at home at 103.  Afebrile in the emergency department.  WBC 11.9,  creatinine 1.5, urinalysis positive nitrates at presentation.  Urine culture pending. 2. AKI: Cr initially 1.5 from unknown baseline at presentation. Worsened to 1.9 on 4/10 and improving on recheck after hydration to 1.7. 3. UTI: In setting of obstructing UPJ stone as above. S/p stent. UCx and Left RP urine cx pending.  -Pain control prn -Void trial -F/u urine cx. Will need culture specific abx at discharge. Tentatively written for cipro, but may need to change once culture returns. -IVF hydration today. Cr spiked after stent placement to 1.9 and improved following hydration at 1.7. Trend creatinine. -Observe another night -Likely home tomorrow -Message sent to [facility] to arrange for f/u to treat stone, likely PCNL   LOS: 0 days   Matt R. Tandrea Kommer MD 07/25/2020, 1:54 PM [facility]  Pager: [phone]

## 2020-07-25 NOTE — Progress Notes (Signed)
Patient is out of room ambulating - at this time he is pleasant telesitter says he cannot

## 2020-07-25 NOTE — Progress Notes (Signed)
Lab reports critical urine values. Provider notified.

## 2020-07-25 NOTE — Progress Notes (Signed)
Patient voided but unable to secure bladder scan awaiting for it to be available - no bladder scan on unit.

## 2020-07-25 NOTE — Anesthesia Postprocedure Evaluation (Signed)
Anesthesia Post Note  Patient: Jonathan Gould  Procedure(s) Performed: CYSTOSCOPY WITH RETROGRADE PYELOGRAM, URETEROSCOPY AND STENT PLACEMENT (Left )  Patient location during evaluation: PACU Anesthesia Type: General Level of consciousness: awake and alert Pain management: pain level controlled Vital Signs Assessment: post-procedure vital signs reviewed and stable Respiratory status: spontaneous breathing, nonlabored ventilation, respiratory function stable and patient connected to nasal cannula oxygen Cardiovascular status: blood pressure returned to baseline and stable Postop Assessment: no apparent nausea or vomiting Anesthetic complications: no   No complications documented.   Last Vitals:  Vitals:   07/24/20 2135 07/25/20 0426  BP: (!) 119/56 102/62  Pulse: 86 (!) 56  Resp: 16 18  Temp: 37.6 C (!) 36.3 C  SpO2: 93% 99%    Last Pain:  Vitals:   07/25/20 0426  TempSrc: Oral  PainSc:                  Corinda Gubler

## 2020-07-26 ENCOUNTER — Encounter: Payer: Self-pay | Admitting: Urology

## 2020-07-26 DIAGNOSIS — N200 Calculus of kidney: Secondary | ICD-10-CM

## 2020-07-26 LAB — CBC WITH DIFFERENTIAL/PLATELET
Abs Immature Granulocytes: 0.06 10*3/uL (ref 0.00–0.07)
Basophils Absolute: 0 10*3/uL (ref 0.0–0.1)
Basophils Relative: 0 %
Eosinophils Absolute: 0 10*3/uL (ref 0.0–0.5)
Eosinophils Relative: 0 %
HCT: 34.6 % — ABNORMAL LOW (ref 39.0–52.0)
Hemoglobin: 11.6 g/dL — ABNORMAL LOW (ref 13.0–17.0)
Immature Granulocytes: 1 %
Lymphocytes Relative: 10 %
Lymphs Abs: 1 10*3/uL (ref 0.7–4.0)
MCH: 31.3 pg (ref 26.0–34.0)
MCHC: 33.5 g/dL (ref 30.0–36.0)
MCV: 93.3 fL (ref 80.0–100.0)
Monocytes Absolute: 0.7 10*3/uL (ref 0.1–1.0)
Monocytes Relative: 6 %
Neutro Abs: 8.8 10*3/uL — ABNORMAL HIGH (ref 1.7–7.7)
Neutrophils Relative %: 83 %
Platelets: 151 10*3/uL (ref 150–400)
RBC: 3.71 MIL/uL — ABNORMAL LOW (ref 4.22–5.81)
RDW: 14.7 % (ref 11.5–15.5)
WBC: 10.6 10*3/uL — ABNORMAL HIGH (ref 4.0–10.5)
nRBC: 0 % (ref 0.0–0.2)

## 2020-07-26 LAB — BASIC METABOLIC PANEL
Anion gap: 6 (ref 5–15)
BUN: 21 mg/dL (ref 8–23)
CO2: 22 mmol/L (ref 22–32)
Calcium: 7.9 mg/dL — ABNORMAL LOW (ref 8.9–10.3)
Chloride: 112 mmol/L — ABNORMAL HIGH (ref 98–111)
Creatinine, Ser: 1.43 mg/dL — ABNORMAL HIGH (ref 0.61–1.24)
GFR, Estimated: 55 mL/min — ABNORMAL LOW (ref >60)
Glucose, Bld: 124 mg/dL — ABNORMAL HIGH (ref 70–99)
Potassium: 4.3 mmol/L (ref 3.5–5.1)
Sodium: 140 mmol/L (ref 135–145)

## 2020-07-26 LAB — URINE CULTURE
Culture: >=100000 — AB
Special Requests: NORMAL

## 2020-07-26 MED ORDER — TAMSULOSIN HCL 0.4 MG PO CAPS: 0.4000 mg | ORAL_CAPSULE | Freq: Every day | ORAL | 0 refills | Status: DC

## 2020-07-26 MED ORDER — OXYBUTYNIN CHLORIDE 5 MG PO TABS: 5.0000 mg | ORAL_TABLET | Freq: Three times a day (TID) | ORAL | 0 refills | Status: DC | PRN

## 2020-07-26 MED ORDER — PHENOL 1.4 % MT LIQD
1.0000 | OROMUCOSAL | Status: DC | PRN
  Filled 2020-07-26: qty 177

## 2020-07-26 MED ORDER — CIPROFLOXACIN HCL 500 MG PO TABS: 500.0000 mg | ORAL_TABLET | Freq: Two times a day (BID) | ORAL | 0 refills | Status: AC

## 2020-07-26 MED ORDER — MENTHOL 3 MG MT LOZG
1.0000 | LOZENGE | OROMUCOSAL | Status: DC | PRN
  Filled 2020-07-26: qty 9

## 2020-07-26 NOTE — Discharge Summary (Signed)
Date of admission: 07/24/2020  Date of discharge: 07/26/2020  Admission diagnosis: Infected, obstructing 64m left UPJ stone  Discharge diagnosis: Same as above  Secondary diagnoses:  Patient Active Problem List   Diagnosis Date Noted  . Left renal stone 07/24/2020   History and Physical: For full details, please see admission history and physical. Briefly, Jonathan Gould a 65y.o. year old patient admitted on 07/24/2020 for urgent left ureteral stent placement with Dr. GAbner Greenspanfor management of urinary obstruction and infection.   Hospital Course: Patient tolerated the procedure well.  He was then transferred to the floor after an uneventful PACU stay.  His hospital course was uncomplicated.  On POD#2 he had met discharge criteria: was eating a regular diet, was up and ambulating independently,  pain was well controlled, was voiding without a catheter, and was ready for discharge.  Laboratory values:  Recent Labs    07/24/20 2214 07/25/20 0418 07/26/20 0624  WBC 14.4* 16.4* 10.6*  HGB 13.5 12.6* 11.6*  HCT 40.0 38.5* 34.6*   Recent Labs    07/25/20 0418 07/25/20 0936 07/26/20 0624  NA 138 135 140  K 4.7 4.5 4.3  CL 108 106 112*  CO2 _0 GLUCOSE 218* 214* 124*  BUN _1 CREATININE 1.90* 1.70* 1.43*  CALCIUM 8.5* 8.1* 7.9*   Results for orders placed or performed during the hospital encounter of 07/24/20  Resp Panel by RT-PCR (Flu A&B, Covid) Nasopharyngeal Swab     Status: None   Collection Time: 07/24/20  4:29 PM   Specimen: Nasopharyngeal Swab; Nasopharyngeal(NP) swabs in vial transport medium  Result Value Ref Range Status   SARS Coronavirus 2 by RT PCR NEGATIVE NEGATIVE Final    Comment: (NOTE) SARS-CoV-2 target nucleic acids are NOT DETECTED.  The SARS-CoV-2 RNA is generally detectable in upper respiratory specimens during the acute phase of infection. The lowest concentration of SARS-CoV-2 viral copies this assay can detect is 138 copies/mL. A negative  result does not preclude SARS-Cov-2 infection and should not be used as the sole basis for treatment or other patient management decisions. A negative result may occur with  improper specimen collection/handling, submission of specimen other than nasopharyngeal swab, presence of viral mutation(s) within the areas targeted by this assay, and inadequate number of viral copies(<138 copies/mL). A negative result must be combined with clinical observations, patient history, and epidemiological information. The expected result is Negative.  Fact Sheet for Patients:  hEntrepreneurPulse.com.au Fact Sheet for Healthcare Providers:  hIncredibleEmployment.be This test is no t yet approved or cleared by the [place] and  has been authorized for detection and/or diagnosis of SARS-CoV-2 by FDA under an Emergency Use Authorization (EUA). This EUA will remain  in effect (meaning this test can be used) for the duration of the COVID-19 declaration under Section 564(b)(1) of the Act, 21 U.S.C.section 360bbb-3(b)(1), unless the authorization is terminated  or revoked sooner.       Influenza A by PCR NEGATIVE NEGATIVE Final   Influenza B by PCR NEGATIVE NEGATIVE Final    Comment: (NOTE) The Xpert Xpress SARS-CoV-2/FLU/RSV plus assay is intended as an aid in the diagnosis of influenza from Nasopharyngeal swab specimens and should not be used as a sole basis for treatment. Nasal washings and aspirates are unacceptable for Xpert Xpress SARS-CoV-2/FLU/RSV testing.  Fact Sheet for Patients: hEntrepreneurPulse.com.au Fact Sheet for Healthcare Providers: hIncredibleEmployment.be This test is not yet approved or cleared by the [place] FDA and has been authorized for detection and/or diagnosis of SARS-CoV-2 by FDA under an Emergency Use Authorization (EUA). This EUA will remain in effect (meaning this test can be used)  for the duration of the COVID-19 declaration under Section 564(b)(1) of the Act, 21 U.S.C. section 360bbb-3(b)(1), unless the authorization is terminated or revoked.  Performed at [facility], [address], [place], [place] [address]   Urine Culture     Status: Abnormal (Preliminary result)   Collection Time: 07/24/20  8:20 PM   Specimen: PATH Other; Urine  Result Value Ref Range Status   Specimen Description   Final    URINE, RANDOM Performed at [facility], [address], [place], [place] [address]    Special Requests   Final    NONE Performed at [facility], [address] [place], [place] [address]    Culture (A)  Final    >=100,000 COLONIES/mL ESCHERICHIA COLI SUSCEPTIBILITIES TO FOLLOW Performed at [facility], [place] [address], [place], [place] [address]    Report Status PENDING  Incomplete  Gram stain     Status: None   Collection Time: 07/24/20  8:20 PM   Specimen: PATH Other; Urine  Result Value Ref Range Status   Specimen Description URINE, RANDOM  Final   Special Requests NONE  Final   Gram Stain   Final    MANY WBC SEEN GRAM NEGATIVE RODS RESULT CALLED TO, READ BACK BY AND VERIFIED WITH: TONATE TATE AT 7867 07/25/20 MF. Performed at [facility], [address] [place],  [address]    Report Status 07/25/2020 FINAL  Final   Disposition: Home  Discharge instruction: The patient was instructed to be ambulatory but told to refrain from operating heavy machinery while on narcotic pain medications.  Discharge medications:  Allergies as of 07/26/2020   No Known Allergies     Medication List    TAKE these medications   benzonatate 200 MG capsule Commonly known as: TESSALON Take 1 capsule (200 mg total) by mouth 3 (three) times daily as needed for cough.   cetirizine 10 MG tablet Commonly known as: ZYRTEC Take 10 mg by mouth daily.   ciprofloxacin 500 MG tablet Commonly known as: Cipro Take 1  tablet (500 mg total) by mouth 2 (two) times daily for 11 days.   docusate sodium 100 MG capsule Commonly known as: Colace Take 1 capsule (100 mg total) by mouth daily.   guaiFENesin-codeine 100-10 MG/5ML syrup Take 10 mLs by mouth 3 (three) times daily as needed for cough (do not drive or operate machinery while taking as can cause drowsiness.).   ipratropium 0.06 % nasal spray Commonly known as: ATROVENT Place 2 sprays into both nostrils 4 (four) times daily as needed for rhinitis.   oxybutynin 5 MG tablet Commonly known as: DITROPAN Take 1 tablet (5 mg total) by mouth every 8 (eight) hours as needed for bladder spasms.   oxyCODONE-acetaminophen 5-325 MG tablet Commonly known as: Percocet Take 1 tablet by mouth every 4 (four) hours as needed for up to 365 doses for severe pain.   tamsulosin 0.4 MG Caps capsule Commonly known as: FLOMAX Take 1 capsule (0.4 mg total) by mouth daily.       Followup:   Follow-up [facility]. Go on 07/28/2020.   Specialty: Urology Why: For postop follow-up and to discuss stone management Contact information: [address] [place]  [address] [phone]

## 2020-07-26 NOTE — Progress Notes (Signed)
Jonathan Gould to be D/C'd Home per MD order.  Discussed prescriptions and follow up appointments with the patient. Prescriptions given to patient, medication list explained in detail. Pt verbalized understanding.  Allergies as of 07/26/2020   No Known Allergies     Medication List    TAKE these medications   benzonatate 200 MG capsule Commonly known as: TESSALON Take 1 capsule (200 mg total) by mouth 3 (three) times daily as needed for cough.   cetirizine 10 MG tablet Commonly known as: ZYRTEC Take 10 mg by mouth daily.   ciprofloxacin 500 MG tablet Commonly known as: Cipro Take 1 tablet (500 mg total) by mouth 2 (two) times daily for 11 days.   docusate sodium 100 MG capsule Commonly known as: Colace Take 1 capsule (100 mg total) by mouth daily.   guaiFENesin-codeine 100-10 MG/5ML syrup Take 10 mLs by mouth 3 (three) times daily as needed for cough (do not drive or operate machinery while taking as can cause drowsiness.).   ipratropium 0.06 % nasal spray Commonly known as: ATROVENT Place 2 sprays into both nostrils 4 (four) times daily as needed for rhinitis.   oxybutynin 5 MG tablet Commonly known as: DITROPAN Take 1 tablet (5 mg total) by mouth every 8 (eight) hours as needed for bladder spasms.   oxyCODONE-acetaminophen 5-325 MG tablet Commonly known as: Percocet Take 1 tablet by mouth every 4 (four) hours as needed for up to 365 doses for severe pain.   tamsulosin 0.4 MG Caps capsule Commonly known as: FLOMAX Take 1 capsule (0.4 mg total) by mouth daily.       Vitals:   07/26/20 0825 07/26/20 1102  BP: 114/61 120/74  Pulse: 64 72  Resp: 16 15  Temp: 98.4 F (36.9 C) 98.6 F (37 C)  SpO2: 94% 96%    Skin clean, dry and intact without evidence of skin break down, no evidence of skin tears noted. IV catheter discontinued intact. Site without signs and symptoms of complications. Dressing and pressure applied. Pt denies pain at this time. No complaints  noted.  An After Visit Summary was printed and given to the patient. Patient escorted via WC, and D/C home via private auto.  Madie Reno, RN

## 2020-07-26 NOTE — Plan of Care (Signed)
  Problem: Health Behavior/Discharge Planning: Goal: Ability to manage health-related needs will improve Outcome: Progressing   Problem: Activity: Goal: Risk for activity intolerance will decrease Outcome: Progressing   Problem: Nutrition: Goal: Adequate nutrition will be maintained Outcome: Progressing   Problem: Elimination: Goal: Will not experience complications related to urinary retention Outcome: Progressing   Problem: Pain Managment: Goal: General experience of comfort will improve Outcome: Progressing   Problem: Safety: Goal: Ability to remain free from injury will improve Outcome: Progressing

## 2020-07-26 NOTE — Progress Notes (Signed)
Urology Inpatient Progress Note  Subjective: No acute events overnight.  Foley discontinued yesterday and he is voiding spontaneously. Creatinine down today, 1.43.  WBC count down today, 10.6. Clean-catch urine culture has resulted with pansensitive E. coli.  On antibiotics as below. Today patient reports some left flank pain with urination.  Anti-infectives: Anti-infectives (From admission, onward)   Start     Dose/Rate Route Frequency Ordered Stop   07/25/20 0700  cefTRIAXone (ROCEPHIN) 1 g in sodium chloride 0.9 % 100 mL IVPB        1 g 200 mL/hr over 30 Minutes Intravenous Every 24 hours 07/24/20 1943     07/24/20 2030  gentamicin (GARAMYCIN) 480 mg in dextrose 5 % 50 mL IVPB        5 mg/kg  96.2 kg 124 mL/hr over 30 Minutes Intravenous  Once 07/24/20 1939 07/24/20 2017   07/24/20 1630  cefTRIAXone (ROCEPHIN) 2 g in sodium chloride 0.9 % 100 mL IVPB        2 g 200 mL/hr over 30 Minutes Intravenous  Once 07/24/20 1621 07/24/20 1706   07/24/20 0000  ciprofloxacin (CIPRO) 500 MG tablet        500 mg Oral 2 times daily 07/24/20 1938 07/31/20 2359      Current Facility-Administered Medications  Medication Dose Route Frequency Provider Last Rate Last Admin  . 0.9 %  sodium chloride infusion   Intravenous Continuous Jannifer Hick, MD 125 mL/hr at 07/26/20 1004 Infusion Verify at 07/26/20 1004  . acetaminophen (TYLENOL) tablet 650 mg  650 mg Oral Q4H PRN Jannifer Hick, MD   650 mg at 07/26/20 0602  . benzonatate (TESSALON) capsule 200 mg  200 mg Oral TID PRN Jannifer Hick, MD      . cefTRIAXone (ROCEPHIN) 1 g in sodium chloride 0.9 % 100 mL IVPB  1 g Intravenous Q24H Jannifer Hick, MD   Stopped at 07/26/20 [phone]  . diphenhydrAMINE (BENADRYL) injection 12.5-25 mg  12.5-25 mg Intravenous Q6H PRN Jannifer Hick, MD       Or  . diphenhydrAMINE (BENADRYL) 12.5 MG/5ML elixir 12.5-25 mg  12.5-25 mg Oral Q6H PRN Jannifer Hick, MD      . guaiFENesin-codeine 100-10 MG/5ML solution 10 mL  10  mL Oral TID PRN Jannifer Hick, MD      . heparin injection 5,000 Units  5,000 Units Subcutaneous Q8H Jannifer Hick, MD   5,000 Units at 07/26/20 0603  . HYDROmorphone (DILAUDID) injection 0.5-1 mg  0.5-1 mg Intravenous Q2H PRN Jannifer Hick, MD      . ipratropium (ATROVENT) 0.06 % nasal spray 2 spray  2 spray Each Nare QID PRN Jannifer Hick, MD      . loratadine (CLARITIN) tablet 10 mg  10 mg Oral Daily Jannifer Hick, MD   10 mg at 07/25/20 0300  . menthol-cetylpyridinium (CEPACOL) lozenge 3 mg  1 lozenge Oral PRN Jannifer Hick, MD      . ondansetron [facility]) injection 4 mg  4 mg Intravenous Q4H PRN Jannifer Hick, MD      . oxyCODONE-acetaminophen (PERCOCET/ROXICET) 5-325 MG per tablet 1-2 tablet  1-2 tablet Oral Q4H PRN Jannifer Hick, MD   2 tablet at 07/25/20 1859  . phenol (CHLORASEPTIC) mouth spray 1 spray  1 spray Mouth/Throat PRN Jannifer Hick, MD      . polyethylene glycol (MIRALAX / GLYCOLAX) packet 17 g  17 g Oral Daily PRN Jettie Pagan  R, MD      . zolpidem (AMBIEN) tablet 5 mg  5 mg Oral QHS PRN,MR X 1 Jannifer Hick, MD       Objective: Vital signs in last 24 hours: Temp:  [98.1 F (36.7 C)-98.9 F (37.2 C)] 98.4 F (36.9 C) (04/11 0825) Pulse Rate:  [57-73] 64 (04/11 0825) Resp:  [16-18] 16 (04/11 0825) BP: (106-115)/(57-63) 114/61 (04/11 0825) SpO2:  [91 %-96 %] 94 % (04/11 0825)  Intake/Output from previous day: 04/10 0701 - 04/11 0700 In: 1320.9 [P.O.:600; I.V.:620.9; IV Piggyback:100] Out: 2075 [Urine:2075] Intake/Output this shift: Total I/O In: 2545.3 [P.O.:240; I.V.:2205.3; IV Piggyback:100] Out: 100 [Urine:100]  Physical Exam Vitals and nursing note reviewed.  Constitutional:      General: He is not in acute distress.    Appearance: He is not ill-appearing, toxic-appearing or diaphoretic.  HENT:     Head: Normocephalic and atraumatic.  Pulmonary:     Effort: Pulmonary effort is normal. No respiratory distress.  Skin:    General: Skin is warm and  dry.  Neurological:     Mental Status: He is alert and oriented to person, place, and time.  Psychiatric:        Mood and Affect: Mood normal.        Behavior: Behavior normal.    Lab Results:  Recent Labs    07/25/20 0418 07/26/20 0624  WBC 16.4* 10.6*  HGB 12.6* 11.6*  HCT 38.5* 34.6*  PLT 176 151   BMET Recent Labs    07/25/20 0936 07/26/20 0624  NA 135 140  K 4.5 4.3  CL 106 112*  CO2 23 22  GLUCOSE 214* 124*  BUN 22 21  CREATININE 1.70* 1.43*  CALCIUM 8.1* 7.9*   Assessment & Plan: 65 year old male s/p left ureteral stent placement in the setting of an obstructing, infected 42mm left UPJ stone, also with 2 nonobstructing punctate left lower pole stones.  Patient clinically improving on culture appropriate antibiotics, creatinine downtrending with unknown baseline.  Patient is tolerating stent well with some left flank pain with urination.  Will start Flomax and oxybutynin for management of stent symptoms.  We will plan for discharge today with plans for outpatient follow-up to discuss definitive stone management, likely PCNL versus ureteroscopy, expect staged ureteroscopy given stone size.  Carman Ching, PA-C 07/26/2020

## 2020-07-27 LAB — URINE CULTURE: Culture: >=100000 — AB

## 2020-07-28 ENCOUNTER — Ambulatory Visit: Payer: Self-pay | Admitting: Urology

## 2020-08-02 ENCOUNTER — Telehealth: Payer: Self-pay

## 2020-08-02 NOTE — Telephone Encounter (Signed)
Patient wife called. She states husband is out of Cipro ( only took it for 7 days) looks like a different provider gave him 7 days and was given an 11 day supply from [place] but pt did not pick up the RX from [place]. Pt has an appt tomorrow. Wife wants to know if pt needs to pick up the other RX to finish the 11 days. ( he finished this morning) please advise.

## 2020-08-02 NOTE — Telephone Encounter (Signed)
sw pt wife, she verbalized understanding and will pick up rx.

## 2020-08-02 NOTE — Telephone Encounter (Signed)
Yes, please take for an additional 4 days to complete 11 days of therapy.

## 2020-08-03 ENCOUNTER — Other Ambulatory Visit: Payer: Self-pay | Admitting: Urology

## 2020-08-03 ENCOUNTER — Other Ambulatory Visit: Payer: Self-pay

## 2020-08-03 ENCOUNTER — Ambulatory Visit: Payer: BC Managed Care – PPO | Admitting: Urology

## 2020-08-03 VITALS — BP 152/80 | HR 89 | Ht 75.0 in | Wt 203.0 lb

## 2020-08-03 DIAGNOSIS — N10 Acute tubulo-interstitial nephritis: Secondary | ICD-10-CM | POA: Diagnosis not present

## 2020-08-03 DIAGNOSIS — N200 Calculus of kidney: Secondary | ICD-10-CM | POA: Diagnosis not present

## 2020-08-03 NOTE — H&P (View-Only) (Signed)
 08/03/2020 12:22 PM   Jonathan Gould 03/03/1956 [id number]  Referring provider: No referring provider defined for this encounter.  Chief Complaint  Patient presents with  . Nephrolithiasis    HPI: 64-year-old male who presents today to discuss definitive management of his left proximal ureteral calculus.  He presented to the emergency room on 07/24/2020 with an obstructing 1.6 mm left UPJ stone with mild hydronephrosis.  His urine was frankly positive and ended up growing pansensitive E. coli in his urine.  He remained in-house for 2 nights ultimately improved and was discharged.  He continues Cipro for total 11-day course which will be completed Friday.  He does report some nausea associated with taking this medication but this is improving.  Reports some flank pain, hematuria along with bladder irritation related to the stent.  No further fevers or chills.  He does have a remote history of kidney stones, had a similar presentation about 20 years ago for which she ultimately underwent ureteroscopy.  He is accompanied today by his wife.   PMH: Past Medical History:  Diagnosis Date  . Kidney stones   . Kidney stones     Surgical History: Past Surgical History:  Procedure Laterality Date  . CYSTOSCOPY WITH RETROGRADE PYELOGRAM, URETEROSCOPY AND STENT PLACEMENT Left 07/24/2020   Procedure: CYSTOSCOPY WITH RETROGRADE PYELOGRAM, URETEROSCOPY AND STENT PLACEMENT;  Surgeon: Gay, Matthew R, MD;  Location: [facility] ORS;  Service: Urology;  Laterality: Left;    Home Medications:  Allergies as of 08/03/2020   No Known Allergies     Medication List       Accurate as of August 03, 2020 12:22 PM. If you have any questions, ask your nurse or doctor.        STOP taking these medications   benzonatate 200 MG capsule Commonly known as: TESSALON Stopped by: Anarosa Kubisiak, MD   guaiFENesin-codeine 100-10 MG/5ML syrup Stopped by: Suprena Travaglini, MD   oxybutynin 5 MG tablet Commonly  known as: DITROPAN Stopped by: Juandedios Dudash, MD   oxyCODONE-acetaminophen 5-325 MG tablet Commonly known as: Percocet Stopped by: Meri Pelot, MD   tamsulosin 0.4 MG Caps capsule Commonly known as: FLOMAX Stopped by: Drakkar Medeiros, MD     TAKE these medications   acetaminophen 500 MG tablet Commonly known as: TYLENOL Take 1,000 mg by mouth every 6 (six) hours as needed.   cetirizine 10 MG tablet Commonly known as: ZYRTEC Take 10 mg by mouth daily.   ciprofloxacin 500 MG tablet Commonly known as: Cipro Take 1 tablet (500 mg total) by mouth 2 (two) times daily for 11 days.   docusate sodium 100 MG capsule Commonly known as: Colace Take 1 capsule (100 mg total) by mouth daily.   ipratropium 0.06 % nasal spray Commonly known as: ATROVENT Place 2 sprays into both nostrils 4 (four) times daily as needed for rhinitis.       Allergies: No Known Allergies  Family History: No family history on file.  Social History:  reports that he has never smoked. His smokeless tobacco use includes chew. He reports current alcohol use of about 7.0 standard drinks of alcohol per week. He reports previous drug use.   Physical Exam: BP (!) 152/80   Pulse 89   Ht 6' 3" (1.905 m)   Wt 203 lb (92.1 kg)   BMI 25.37 kg/m   Constitutional:  Alert and oriented, No acute distress.  Accompanied by wife today. HEENT: [place] AT, moist mucus membranes.  Trachea midline, no masses.   Cardiovascular: No clubbing, cyanosis, or edema. Respiratory: Normal respiratory effort, no increased work of breathing. Skin: No rashes, bruises or suspicious lesions. Neurologic: Grossly intact, no focal deficits, moving all 4 extremities. Psychiatric: Normal mood and affect.  Laboratory Data: Lab Results  Component Value Date   WBC 10.6 (H) 07/26/2020   HGB 11.6 (L) 07/26/2020   HCT 34.6 (L) 07/26/2020   MCV 93.3 07/26/2020   PLT 151 07/26/2020    Lab Results  Component Value Date   CREATININE 1.43 (H)  07/26/2020    Urinalysis Urine today c/w stent  Pertinent Imaging: CT Renal Stone Study  Narrative CLINICAL DATA:  Left-sided flank pain and fever.  EXAM: CT ABDOMEN AND PELVIS WITHOUT CONTRAST  TECHNIQUE: Multidetector CT imaging of the abdomen and pelvis was performed following the standard protocol without IV contrast.  COMPARISON:  Abdominal x-ray from same day. CT abdomen pelvis dated January 09, 2011.  FINDINGS: Lower chest: Mild bibasilar atelectasis/scarring.  Hepatobiliary: No focal liver abnormality is seen. No gallstones, gallbladder wall thickening, or biliary dilatation.  Pancreas: Unremarkable. No pancreatic ductal dilatation or surrounding inflammatory changes.  Spleen: Normal in size without focal abnormality.  Adrenals/Urinary Tract: Adrenal glands are unremarkable. Large 1.6 cm calculus at the left UPJ with resultant mild left hydronephrosis. There are two additional punctate calculi in the lower pole of the left kidney. Punctate calculus in the upper pole of the right kidney. Left perinephric fat stranding. The bladder is unremarkable.  Stomach/Bowel: Small hiatal hernia. The stomach is otherwise within normal limits. No bowel wall thickening, distention, or surrounding inflammatory changes. Mild to moderate left-sided colonic diverticulosis. Normal appendix.  Vascular/Lymphatic: Aortic atherosclerosis. No enlarged abdominal or pelvic lymph nodes.  Reproductive: Prostate is unremarkable.  Other: Trace free fluid in the pelvis. Slightly increased small fat containing left inguinal hernia. No pneumoperitoneum.  Musculoskeletal: No acute or significant osseous findings.  IMPRESSION: 1. Large 1.6 cm calculus at the left UPJ with resultant mild left hydronephrosis. 2. Additional punctate bilateral nephrolithiasis. 3. Aortic Atherosclerosis (ICD10-I70.0).   Electronically Signed By: Obie Dredge M.D. On: 07/24/2020 16:55  CT scan  images, personally reviewed.  HFU approximately 1400 cc.  Agree with radiology interpretation.  Assessment & Plan:    1. Left renal stone 1.6 cm left proximal ureteral calculus  We discussed various treatment options for urolithiasis including observation with or without medical expulsive therapy, shockwave lithotripsy (SWL), ureteroscopy and laser lithotripsy with stent placement, and percutaneous nephrolithotomy.   We discussed that management is based on stone size, location, density, patient co-morbidities, and patient preference.   SWL has a lower stone free rate in a single procedure, but also a lower complication rate compared to ureteroscopy and avoids a stent and associated stent related symptoms. Possible complications include renal hematoma, steinstrasse, and need for additional treatment. We discussed the role of his increased skin to stone distance can lead to decreased efficacy with shockwave lithotripsy.   Ureteroscopy with laser lithotripsy and stent placement has a higher stone free rate than SWL in a single procedure, however increased complication rate including possible infection, ureteral injury, bleeding, and stent related morbidity. Common stent related symptoms include dysuria, urgency/frequency, and flank pain.   After an extensive discussion of the risks and benefits of the above treatment options, the patient would like to proceed with ureteroscopy, LL, stent exchange  - Urinalysis, Complete - CULTURE, URINE COMPREHENSIVE  2. Acute pyelonephritis Complete abx, repeat urine culture today   Vanna Scotland, MD  [facility]  [address], [place], [place] [address] [phone]

## 2020-08-03 NOTE — Progress Notes (Signed)
08/03/2020 12:22 PM   Danie Chandler [birth year] [id number]  Referring provider: No referring provider defined for this encounter.  Chief Complaint  Patient presents with  . Nephrolithiasis    HPI: 65 year old male who presents today to discuss definitive management of his left proximal ureteral calculus.  He presented to the emergency room on 07/24/2020 with an obstructing 1.6 mm left UPJ stone with mild hydronephrosis.  His urine was frankly positive and ended up growing pansensitive E. coli in his urine.  He remained in-house for 2 nights ultimately improved and was discharged.  He continues Cipro for total 11-day course which will be completed Friday.  He does report some nausea associated with taking this medication but this is improving.  Reports some flank pain, hematuria along with bladder irritation related to the stent.  No further fevers or chills.  He does have a remote history of kidney stones, had a similar presentation about 20 years ago for which she ultimately underwent ureteroscopy.  He is accompanied today by his wife.   PMH: Past Medical History:  Diagnosis Date  . Kidney stones   . Kidney stones     Surgical History: Past Surgical History:  Procedure Laterality Date  . CYSTOSCOPY WITH RETROGRADE PYELOGRAM, URETEROSCOPY AND STENT PLACEMENT Left 07/24/2020   Procedure: CYSTOSCOPY WITH RETROGRADE PYELOGRAM, URETEROSCOPY AND STENT PLACEMENT;  Surgeon: Jannifer Hick, MD;  Location: [facility] ORS;  Service: Urology;  Laterality: Left;    Home Medications:  Allergies as of 08/03/2020   No Known Allergies     Medication List       Accurate as of August 03, 2020 12:22 PM. If you have any questions, ask your nurse or doctor.        STOP taking these medications   benzonatate 200 MG capsule Commonly known as: TESSALON Stopped by: Vanna Scotland, MD   guaiFENesin-codeine 100-10 MG/5ML syrup Stopped by: Vanna Scotland, MD   oxybutynin 5 MG tablet Commonly  known as: DITROPAN Stopped by: Vanna Scotland, MD   oxyCODONE-acetaminophen 5-325 MG tablet Commonly known as: Percocet Stopped by: Vanna Scotland, MD   tamsulosin 0.4 MG Caps capsule Commonly known as: FLOMAX Stopped by: Vanna Scotland, MD     TAKE these medications   acetaminophen 500 MG tablet Commonly known as: TYLENOL Take 1,000 mg by mouth every 6 (six) hours as needed.   cetirizine 10 MG tablet Commonly known as: ZYRTEC Take 10 mg by mouth daily.   ciprofloxacin 500 MG tablet Commonly known as: Cipro Take 1 tablet (500 mg total) by mouth 2 (two) times daily for 11 days.   docusate sodium 100 MG capsule Commonly known as: Colace Take 1 capsule (100 mg total) by mouth daily.   ipratropium 0.06 % nasal spray Commonly known as: ATROVENT Place 2 sprays into both nostrils 4 (four) times daily as needed for rhinitis.       Allergies: No Known Allergies  Family History: No family history on file.  Social History:  reports that he has never smoked. His smokeless tobacco use includes chew. He reports current alcohol use of about 7.0 standard drinks of alcohol per week. He reports previous drug use.   Physical Exam: BP (!) 152/80   Pulse 89   Ht 6\' 3"  (1.905 m)   Wt 203 lb (92.1 kg)   BMI 25.37 kg/m   Constitutional:  Alert and oriented, No acute distress.  Accompanied by wife today. HEENT: [place] AT, moist mucus membranes.  Trachea midline, no masses.  Cardiovascular: No clubbing, cyanosis, or edema. Respiratory: Normal respiratory effort, no increased work of breathing. Skin: No rashes, bruises or suspicious lesions. Neurologic: Grossly intact, no focal deficits, moving all 4 extremities. Psychiatric: Normal mood and affect.  Laboratory Data: Lab Results  Component Value Date   WBC 10.6 (H) 07/26/2020   HGB 11.6 (L) 07/26/2020   HCT 34.6 (L) 07/26/2020   MCV 93.3 07/26/2020   PLT 151 07/26/2020    Lab Results  Component Value Date   CREATININE 1.43 (H)  07/26/2020    Urinalysis Urine today c/w stent  Pertinent Imaging: CT Renal Stone Study  Narrative CLINICAL DATA:  Left-sided flank pain and fever.  EXAM: CT ABDOMEN AND PELVIS WITHOUT CONTRAST  TECHNIQUE: Multidetector CT imaging of the abdomen and pelvis was performed following the standard protocol without IV contrast.  COMPARISON:  Abdominal x-ray from same day. CT abdomen pelvis dated January 09, 2011.  FINDINGS: Lower chest: Mild bibasilar atelectasis/scarring.  Hepatobiliary: No focal liver abnormality is seen. No gallstones, gallbladder wall thickening, or biliary dilatation.  Pancreas: Unremarkable. No pancreatic ductal dilatation or surrounding inflammatory changes.  Spleen: Normal in size without focal abnormality.  Adrenals/Urinary Tract: Adrenal glands are unremarkable. Large 1.6 cm calculus at the left UPJ with resultant mild left hydronephrosis. There are two additional punctate calculi in the lower pole of the left kidney. Punctate calculus in the upper pole of the right kidney. Left perinephric fat stranding. The bladder is unremarkable.  Stomach/Bowel: Small hiatal hernia. The stomach is otherwise within normal limits. No bowel wall thickening, distention, or surrounding inflammatory changes. Mild to moderate left-sided colonic diverticulosis. Normal appendix.  Vascular/Lymphatic: Aortic atherosclerosis. No enlarged abdominal or pelvic lymph nodes.  Reproductive: Prostate is unremarkable.  Other: Trace free fluid in the pelvis. Slightly increased small fat containing left inguinal hernia. No pneumoperitoneum.  Musculoskeletal: No acute or significant osseous findings.  IMPRESSION: 1. Large 1.6 cm calculus at the left UPJ with resultant mild left hydronephrosis. 2. Additional punctate bilateral nephrolithiasis. 3. Aortic Atherosclerosis (ICD10-I70.0).   Electronically Signed By: Obie Dredge M.D. On: 07/24/2020 16:55  CT scan  images, personally reviewed.  HFU approximately 1400 cc.  Agree with radiology interpretation.  Assessment & Plan:    1. Left renal stone 1.6 cm left proximal ureteral calculus  We discussed various treatment options for urolithiasis including observation with or without medical expulsive therapy, shockwave lithotripsy (SWL), ureteroscopy and laser lithotripsy with stent placement, and percutaneous nephrolithotomy.   We discussed that management is based on stone size, location, density, patient co-morbidities, and patient preference.   SWL has a lower stone free rate in a single procedure, but also a lower complication rate compared to ureteroscopy and avoids a stent and associated stent related symptoms. Possible complications include renal hematoma, steinstrasse, and need for additional treatment. We discussed the role of his increased skin to stone distance can lead to decreased efficacy with shockwave lithotripsy.   Ureteroscopy with laser lithotripsy and stent placement has a higher stone free rate than SWL in a single procedure, however increased complication rate including possible infection, ureteral injury, bleeding, and stent related morbidity. Common stent related symptoms include dysuria, urgency/frequency, and flank pain.   After an extensive discussion of the risks and benefits of the above treatment options, the patient would like to proceed with ureteroscopy, LL, stent exchange  - Urinalysis, Complete - CULTURE, URINE COMPREHENSIVE  2. Acute pyelonephritis Complete abx, repeat urine culture today   Vanna Scotland, MD  [facility]  [address], [place], [place] [address] [phone]

## 2020-08-05 LAB — URINALYSIS, COMPLETE
Bilirubin, UA: NEGATIVE
Glucose, UA: NEGATIVE
Ketones, UA: NEGATIVE
Nitrite, UA: NEGATIVE
Specific Gravity, UA: >1.03 — ABNORMAL HIGH (ref 1.005–1.030)
Urobilinogen, Ur: 0.2 mg/dL (ref 0.2–1.0)
pH, UA: 5 (ref 5.0–7.5)

## 2020-08-05 LAB — MICROSCOPIC EXAMINATION: Bacteria, UA: NONE SEEN

## 2020-08-07 LAB — CULTURE, URINE COMPREHENSIVE

## 2020-08-09 ENCOUNTER — Encounter
Admission: RE | Admit: 2020-08-09 | Discharge: 2020-08-09 | Disposition: A | Discharge status: Home / Self Care | Payer: BC Managed Care – PPO | Source: Ambulatory Visit | Attending: Urology | Admitting: Urology

## 2020-08-09 ENCOUNTER — Other Ambulatory Visit: Payer: Self-pay

## 2020-08-09 NOTE — Patient Instructions (Signed)
Your procedure is scheduled on:08-16-20 MONDAY Report to the Registration Desk on the 1st floor of the Medical Mall-Then proceed to the 2nd floor Surgery Desk in the Medical Mall To find out your arrival time, please call [phone] between 1PM - 3PM on:08-13-20 FRIDAY  REMEMBER: Instructions that are not followed completely may result in serious medical risk, up to and including death; or upon the discretion of your surgeon and anesthesiologist your surgery may need to be rescheduled.  Do not eat food after midnight the night before surgery.  No gum chewing, lozengers or hard candies.  You may however, drink CLEAR liquids up to 2 hours before you are scheduled to arrive for your surgery. Do not drink anything within 2 hours of your scheduled arrival time.  Clear liquids include: - water  - apple juice without pulp - gatorade  - black coffee or tea (Do NOT add milk or creamers to the coffee or tea) Do NOT drink anything that is not on this list.  DO NOT TAKE ANY MEDICATION THE MORNING OF SURGERY  One week prior to surgery: Stop Anti-inflammatories (NSAIDS) such as Advil, Aleve, Ibuprofen, Motrin, Naproxen, Naprosyn and Aspirin based products such as Excedrin, Goodys Powder, BC Powder-OK TO TAKE TYLENOL IF NEEDED  Stop ANY OVER THE COUNTER supplements until after surgery.  No Alcohol for 24 hours before or after surgery.  No Smoking including e-cigarettes for 24 hours prior to surgery.  No chewable tobacco products for at least 6 hours prior to surgery.  No nicotine patches on the day of surgery.  Do not use any "recreational" drugs for at least a week prior to your surgery.  Please be advised that the combination of cocaine and anesthesia may have negative outcomes, up to and including death. If you test positive for cocaine, your surgery will be cancelled.  On the morning of surgery brush your teeth with toothpaste and water, you may rinse your mouth with mouthwash if you  wish. Do not swallow any toothpaste or mouthwash.  Do not wear jewelry, make-up, hairpins, clips or nail polish.  Do not wear lotions, powders, or perfumes.   Do not shave body from the neck down 48 hours prior to surgery just in case you cut yourself which could leave a site for infection.  Also, freshly shaved skin may become irritated if using the CHG soap.  Contact lenses, hearing aids and dentures may not be worn into surgery.  Do not bring valuables to the hospital. [facility] is not responsible for any missing/lost belongings or valuables.   Notify your doctor if there is any change in your medical condition (cold, fever, infection).  Wear comfortable clothing (specific to your surgery type) to the hospital.  Plan for stool softeners for home use; pain medications have a tendency to cause constipation. You can also help prevent constipation by eating foods high in fiber such as fruits and vegetables and drinking plenty of fluids as your diet allows.  After surgery, you can help prevent lung complications by doing breathing exercises.  Take deep breaths and cough every 1-2 hours. Your doctor may order a device called an Incentive Spirometer to help you take deep breaths. When coughing or sneezing, hold a pillow firmly against your incision with both hands. This is called "splinting." Doing this helps protect your incision. It also decreases belly discomfort.  If you are being admitted to the hospital overnight, leave your suitcase in the car. After surgery it may be brought to  your room.  If you are being discharged the day of surgery, you will not be allowed to drive home. You will need a responsible adult (18 years or older) to drive you home and stay with you that night.   If you are taking public transportation, you will need to have a responsible adult (18 years or older) with you. Please confirm with your physician that it is acceptable to use public transportation.    Please call the Pre-admissions Testing Dept. at [phone] if you have any questions about these instructions.  Surgery Visitation Policy:  Patients undergoing a surgery or procedure may have one family member or support person with them as long as that person is not COVID-19 positive or experiencing its symptoms.  That person may remain in the waiting area during the procedure.  Inpatient Visitation:    Visiting hours are 7 a.m. to 8 p.m. Inpatients will be allowed two visitors daily. The visitors may change each day during the patient's stay. No visitors under the age of 45. Any visitor under the age of 48 must be accompanied by an adult. The visitor must pass COVID-19 screenings, use hand sanitizer when entering and exiting the patient's room and wear a mask at all times, including in the patient's room. Patients must also wear a mask when staff or their visitor are in the room. Masking is required regardless of vaccination status.

## 2020-08-12 ENCOUNTER — Other Ambulatory Visit: Payer: Self-pay

## 2020-08-12 ENCOUNTER — Other Ambulatory Visit
Admission: RE | Admit: 2020-08-12 | Discharge: 2020-08-12 | Disposition: A | Discharge status: Home / Self Care | Payer: BC Managed Care – PPO | Source: Ambulatory Visit | Attending: Urology | Admitting: Urology

## 2020-08-12 DIAGNOSIS — Z01812 Encounter for preprocedural laboratory examination: Secondary | ICD-10-CM | POA: Diagnosis not present

## 2020-08-12 DIAGNOSIS — Z20822 Contact with and (suspected) exposure to covid-19: Secondary | ICD-10-CM | POA: Insufficient documentation

## 2020-08-12 LAB — SARS CORONAVIRUS 2 (TAT 6-24 HRS): SARS Coronavirus 2: NEGATIVE

## 2020-08-16 ENCOUNTER — Ambulatory Visit: Payer: BC Managed Care – PPO

## 2020-08-16 ENCOUNTER — Ambulatory Visit: Payer: BC Managed Care – PPO | Admitting: Anesthesiology

## 2020-08-16 ENCOUNTER — Encounter
Admission: RE | Disposition: A | Discharge status: Home / Self Care | Payer: Self-pay | Source: Home / Self Care | Attending: Urology

## 2020-08-16 ENCOUNTER — Encounter: Payer: Self-pay | Admitting: Urology

## 2020-08-16 ENCOUNTER — Ambulatory Visit
Admission: RE | Admit: 2020-08-16 | Discharge: 2020-08-16 | Disposition: A | Discharge status: Home / Self Care | Payer: BC Managed Care – PPO | Attending: Urology | Admitting: Urology

## 2020-08-16 ENCOUNTER — Other Ambulatory Visit: Payer: Self-pay

## 2020-08-16 DIAGNOSIS — N202 Calculus of kidney with calculus of ureter: Secondary | ICD-10-CM | POA: Insufficient documentation

## 2020-08-16 DIAGNOSIS — Z87442 Personal history of urinary calculi: Secondary | ICD-10-CM | POA: Insufficient documentation

## 2020-08-16 DIAGNOSIS — N200 Calculus of kidney: Secondary | ICD-10-CM

## 2020-08-16 HISTORY — PX: CYSTOSCOPY/URETEROSCOPY/HOLMIUM LASER/STENT PLACEMENT: SHX6546

## 2020-08-16 SURGERY — CYSTOSCOPY/URETEROSCOPY/HOLMIUM LASER/STENT PLACEMENT
Anesthesia: General | Site: Ureter | Laterality: Left

## 2020-08-16 MED ORDER — ONDANSETRON HCL 4 MG/2ML IJ SOLN
INTRAMUSCULAR | Status: AC
  Filled 2020-08-16: qty 2

## 2020-08-16 MED ORDER — KETOROLAC TROMETHAMINE 30 MG/ML IJ SOLN
INTRAMUSCULAR | Status: DC | PRN
  Administered 2020-08-16: 30 mg via INTRAVENOUS

## 2020-08-16 MED ORDER — ONDANSETRON HCL 4 MG/2ML IJ SOLN
4.0000 mg | Freq: Once | INTRAMUSCULAR | Status: DC | PRN
Start: 2020-08-16 — End: 2020-08-16

## 2020-08-16 MED ORDER — ROCURONIUM BROMIDE 100 MG/10ML IV SOLN
INTRAVENOUS | Status: DC | PRN
  Administered 2020-08-16 (×3): 10 mg via INTRAVENOUS
  Administered 2020-08-16: 50 mg via INTRAVENOUS

## 2020-08-16 MED ORDER — PROPOFOL 10 MG/ML IV BOLUS
INTRAVENOUS | Status: AC
  Filled 2020-08-16: qty 20

## 2020-08-16 MED ORDER — CEFAZOLIN SODIUM-DEXTROSE 2-4 GM/100ML-% IV SOLN
2.0000 g | INTRAVENOUS | Status: AC
  Administered 2020-08-16: 2 g via INTRAVENOUS

## 2020-08-16 MED ORDER — KETOROLAC TROMETHAMINE 30 MG/ML IJ SOLN
INTRAMUSCULAR | Status: AC
  Filled 2020-08-16: qty 1

## 2020-08-16 MED ORDER — ONDANSETRON HCL 4 MG/2ML IJ SOLN
INTRAMUSCULAR | Status: DC | PRN
  Administered 2020-08-16: 4 mg via INTRAVENOUS

## 2020-08-16 MED ORDER — OXYCODONE HCL 5 MG PO TABS: 5.0000 mg | ORAL_TABLET | Freq: Once | ORAL | Status: DC | PRN

## 2020-08-16 MED ORDER — PROPOFOL 10 MG/ML IV BOLUS
INTRAVENOUS | Status: DC | PRN
  Administered 2020-08-16: 150 mg via INTRAVENOUS

## 2020-08-16 MED ORDER — FAMOTIDINE 20 MG PO TABS
20.0000 mg | ORAL_TABLET | Freq: Once | ORAL | Status: AC
  Administered 2020-08-16: 20 mg via ORAL

## 2020-08-16 MED ORDER — IOPAMIDOL (ISOVUE-200) INJECTION 41%
INTRAVENOUS | Status: DC | PRN
  Administered 2020-08-16: 6 mL via INTRAVENOUS

## 2020-08-16 MED ORDER — ACETAMINOPHEN 10 MG/ML IV SOLN
INTRAVENOUS | Status: AC
  Filled 2020-08-16: qty 100

## 2020-08-16 MED ORDER — CHLORHEXIDINE GLUCONATE 0.12 % MT SOLN
15.0000 mL | Freq: Once | OROMUCOSAL | Status: AC
  Administered 2020-08-16: 15 mL via OROMUCOSAL

## 2020-08-16 MED ORDER — FENTANYL CITRATE (PF) 100 MCG/2ML IJ SOLN
INTRAMUSCULAR | Status: AC
  Filled 2020-08-16: qty 2

## 2020-08-16 MED ORDER — FAMOTIDINE 20 MG PO TABS
ORAL_TABLET | ORAL | Status: AC
  Filled 2020-08-16: qty 1

## 2020-08-16 MED ORDER — EPHEDRINE SULFATE 50 MG/ML IJ SOLN
INTRAMUSCULAR | Status: DC | PRN
  Administered 2020-08-16: 15 mg via INTRAVENOUS
  Administered 2020-08-16: 5 mg via INTRAVENOUS

## 2020-08-16 MED ORDER — ROCURONIUM BROMIDE 10 MG/ML (PF) SYRINGE
PREFILLED_SYRINGE | INTRAVENOUS | Status: AC
  Filled 2020-08-16: qty 10

## 2020-08-16 MED ORDER — DEXAMETHASONE SODIUM PHOSPHATE 10 MG/ML IJ SOLN
INTRAMUSCULAR | Status: AC
  Filled 2020-08-16: qty 1

## 2020-08-16 MED ORDER — CEFAZOLIN SODIUM-DEXTROSE 2-4 GM/100ML-% IV SOLN
INTRAVENOUS | Status: AC
  Filled 2020-08-16: qty 100

## 2020-08-16 MED ORDER — CHLORHEXIDINE GLUCONATE 0.12 % MT SOLN
OROMUCOSAL | Status: AC
  Filled 2020-08-16: qty 15

## 2020-08-16 MED ORDER — ACETAMINOPHEN 10 MG/ML IV SOLN
INTRAVENOUS | Status: DC | PRN
  Administered 2020-08-16: 1000 mg via INTRAVENOUS

## 2020-08-16 MED ORDER — SUGAMMADEX SODIUM 200 MG/2ML IV SOLN
INTRAVENOUS | Status: DC | PRN
  Administered 2020-08-16: 200 mg via INTRAVENOUS

## 2020-08-16 MED ORDER — FENTANYL CITRATE (PF) 100 MCG/2ML IJ SOLN
INTRAMUSCULAR | Status: DC | PRN
  Administered 2020-08-16 (×2): 50 ug via INTRAVENOUS

## 2020-08-16 MED ORDER — DEXAMETHASONE SODIUM PHOSPHATE 10 MG/ML IJ SOLN
INTRAMUSCULAR | Status: DC | PRN
  Administered 2020-08-16: 10 mg via INTRAVENOUS

## 2020-08-16 MED ORDER — LIDOCAINE HCL (PF) 2 % IJ SOLN
INTRAMUSCULAR | Status: AC
  Filled 2020-08-16: qty 5

## 2020-08-16 MED ORDER — LIDOCAINE HCL (CARDIAC) PF 100 MG/5ML IV SOSY
PREFILLED_SYRINGE | INTRAVENOUS | Status: DC | PRN
  Administered 2020-08-16: 100 mg via INTRAVENOUS

## 2020-08-16 MED ORDER — EPHEDRINE 5 MG/ML INJ
INTRAVENOUS | Status: AC
  Filled 2020-08-16: qty 10

## 2020-08-16 MED ORDER — LACTATED RINGERS IV SOLN: INTRAVENOUS | Status: DC

## 2020-08-16 MED ORDER — FENTANYL CITRATE (PF) 100 MCG/2ML IJ SOLN: 25.0000 ug | INTRAMUSCULAR | Status: DC | PRN

## 2020-08-16 MED ORDER — ACETAMINOPHEN 10 MG/ML IV SOLN: 1000.0000 mg | Freq: Once | INTRAVENOUS | Status: DC | PRN

## 2020-08-16 MED ORDER — ORAL CARE MOUTH RINSE: 15.0000 mL | Freq: Once | OROMUCOSAL | Status: AC

## 2020-08-16 MED ORDER — OXYCODONE HCL 5 MG/5ML PO SOLN
5.0000 mg | Freq: Once | ORAL | Status: DC | PRN
Start: 2020-08-16 — End: 2020-08-16

## 2020-08-16 SURGICAL SUPPLY — 28 items
BAG DRAIN CYSTO-URO LG1000N (MISCELLANEOUS) ×2 IMPLANT
BASKET ZERO TIP 1.9FR (BASKET) ×2 IMPLANT
BRUSH SCRUB EZ 1% IODOPHOR (MISCELLANEOUS) ×2 IMPLANT
BSKT STON RTRVL ZERO TP 1.9FR (BASKET) ×1
CATH URET FLEX-TIP 2 LUMEN 10F (CATHETERS) IMPLANT
CATH URETL 5X70 OPEN END (CATHETERS) ×2 IMPLANT
CNTNR SPEC 2.5X3XGRAD LEK (MISCELLANEOUS) ×1
CONT SPEC 4OZ STER OR WHT (MISCELLANEOUS) ×1
CONT SPEC 4OZ STRL OR WHT (MISCELLANEOUS) ×1
CONTAINER SPEC 2.5X3XGRAD LEK (MISCELLANEOUS) ×1 IMPLANT
DRAPE UTILITY 15X26 TOWEL STRL (DRAPES) ×2 IMPLANT
GLOVE SURG ENC MOIS LTX SZ6.5 (GLOVE) ×2 IMPLANT
GOWN STRL REUS W/ TWL LRG LVL3 (GOWN DISPOSABLE) ×2 IMPLANT
GOWN STRL REUS W/TWL LRG LVL3 (GOWN DISPOSABLE) ×4
GUIDEWIRE GREEN .038 145CM (MISCELLANEOUS) IMPLANT
GUIDEWIRE STR DUAL SENSOR (WIRE) ×2 IMPLANT
INFUSOR MANOMETER BAG 3000ML (MISCELLANEOUS) ×4 IMPLANT
IV NS IRRIG 3000ML ARTHROMATIC (IV SOLUTION) ×4 IMPLANT
KIT TURNOVER CYSTO (KITS) ×2 IMPLANT
MANIFOLD NEPTUNE II (INSTRUMENTS) IMPLANT
PACK CYSTO AR (MISCELLANEOUS) ×2 IMPLANT
SET CYSTO W/LG BORE CLAMP LF (SET/KITS/TRAYS/PACK) ×2 IMPLANT
SHEATH URETERAL 12FRX35CM (MISCELLANEOUS) IMPLANT
STENT URET 6FRX24 CONTOUR (STENTS) IMPLANT
STENT URET 6FRX26 CONTOUR (STENTS) ×2 IMPLANT
SURGILUBE 2OZ TUBE FLIPTOP (MISCELLANEOUS) ×2 IMPLANT
TRACTIP FLEXIVA PULSE ID 200 (Laser) ×2 IMPLANT
WATER STERILE IRR 1000ML POUR (IV SOLUTION) ×2 IMPLANT

## 2020-08-16 NOTE — Interval H&P Note (Signed)
History and Physical Interval Note:  08/16/2020 10:11 AM  Jonathan Gould  has presented today for surgery, with the diagnosis of left ureteral Stone.  The various methods of treatment have been discussed with the patient and family. After consideration of risks, benefits and other options for treatment, the patient has consented to  Procedure(s): CYSTOSCOPY/URETEROSCOPY/HOLMIUM LASER/STENT EXCHANGE (Left) as a surgical intervention.  The patient's history has been reviewed, patient examined, no change in status, stable for surgery.  I have reviewed the patient's chart and labs.  Questions were answered to the patient's satisfaction.    RRR CTAB   Vanna Scotland

## 2020-08-16 NOTE — Anesthesia Preprocedure Evaluation (Signed)
Anesthesia Evaluation  Patient identified by MRN, date of birth, ID band Patient awake  General Assessment Comment:Pt with kidney stone. In pain, had nausea and vomiting this morning  Reviewed: Allergy & Precautions, NPO status , Patient's Chart, lab work & pertinent test results  History of Anesthesia Complications Negative for: history of anesthetic complications  Airway Mallampati: II  TM Distance: >3 FB Neck ROM: Full    Dental  (+) Poor Dentition   Pulmonary neg pulmonary ROS, neg sleep apnea, neg COPD, Patient abstained from smoking.Not current smoker,    Pulmonary exam normal breath sounds clear to auscultation       Cardiovascular Exercise Tolerance: Good METS(-) hypertension(-) CAD and (-) Past MI negative cardio ROS  (-) dysrhythmias  Rhythm:Regular Rate:Normal - Systolic murmurs    Neuro/Psych negative neurological ROS  negative psych ROS   GI/Hepatic neg GERD  ,(+)     (-) substance abuse  ,   Endo/Other  neg diabetes  Renal/GU negative Renal ROS     Musculoskeletal   Abdominal   Peds  Hematology   Anesthesia Other Findings Past Medical History: No date: Kidney stones No date: Kidney stones  Reproductive/Obstetrics                             Anesthesia Physical  Anesthesia Plan  ASA: II  Anesthesia Plan: General   Post-op Pain Management:    Induction: Intravenous and Rapid sequence  PONV Risk Score and Plan: 3 and Ondansetron and Dexamethasone  Airway Management Planned: Oral ETT and Video Laryngoscope Planned  Additional Equipment: None  Intra-op Plan:   Post-operative Plan: Extubation in OR  Informed Consent: I have reviewed the patients History and Physical, chart, labs and discussed the procedure including the risks, benefits and alternatives for the proposed anesthesia with the patient or authorized representative who has indicated his/her  understanding and acceptance.     Dental advisory given  Plan Discussed with: CRNA and Surgeon  Anesthesia Plan Comments: (Discussed risks of anesthesia with patient, including PONV, sore throat, lip/dental damage. Rare risks discussed as well, such as cardiorespiratory and neurological sequelae. Patient understands.)        Anesthesia Quick Evaluation

## 2020-08-16 NOTE — Op Note (Signed)
Date of procedure: 08/16/20  Preoperative diagnosis:  1. Left ureteral stone 2. Left kidney stone  Postoperative diagnosis:  1. Same as above  Procedure: 1. Left ureteroscopy with laser lithotripsy 2. Basket extraction of stone fragment 3. Left ureteral stent exchange 4. Left retrograde 5. Interpretation of fluoroscopy less than 30 minutes  Surgeon: Vanna Scotland, MD  Anesthesia: General  Complications: None  Intraoperative findings: Large 1.6 cm proximal ureteral stone identified within the renal pelvis.  2 additional nonobstructing stones also treated.  Significant stone dust/debris.  Minimal ureteral trauma.  EBL: Minimal  Specimens: Stone fragment  Drains: 6 x 26 French double-J ureteral stent on left  Indication: Jonathan Gould is a 65 y.o. patient with 1.6 cm left UVJ stone along with nonobstructing left-sided stones who presents today for definitive management of his stone..  After reviewing the management options for treatment, he elected to proceed with the above surgical procedure(s). We have discussed the potential benefits and risks of the procedure, side effects of the proposed treatment, the likelihood of the patient achieving the goals of the procedure, and any potential problems that might occur during the procedure or recuperation. Informed consent has been obtained.  Description of procedure:  The patient was taken to the operating room and general anesthesia was induced.  The patient was placed in the dorsal lithotomy position, prepped and draped in the usual sterile fashion, and preoperative antibiotics were administered. A preoperative time-out was performed.   A 21 French scope was advanced per urethra into the bladder.  Attention was turned to the left ureteral orifice from which a ureteral stent was seen emanating.  The distal coil of the stent was grasped and brought to the level of the urethral meatus.  It was then cannulated using a sensor wire up to the  level of the kidney.  The stent was removed and the wire was left in place.  A dual-lumen access sheath was used to introduce a Super Stiff wire up to the level of the kidney.  The Super Stiff wire was used as a working wire at which time a Adriana Simas 12/14 [place] ureteral access sheath was advanced easily over the wire up to the level of the proximal ureter.  The safety wire was excluded.  A dual digital flexible ureteroscope was then brought in and advanced through the UPJ which was somewhat edematous where the stone was encountered within the renal pelvis.  A 242 m laser fiber was then brought in and using initially primarily dusting settings of 0.3 J and 60 Hz, the stone was fragmented into innumerable pieces.  This created a lot of stone dust material.  I did try to peel the laser settings on the seem to be the most effective.  There were large collections of debris primarily in the midpole calyces.  Using 1.9 tipless nitinol basket to remove the larger pieces and then further dusted the remaining fragments until no fragments remained larger than the tip of the laser fiber.  This was somewhat time-consuming but effective.  I then advanced the scope into the lower pole where 2 additional nonobstructing stones were identified and obliterated.  I also used the basket to the larger pieces in the lower pole.  Ultimately, I was able to clear the entire collecting system of the all significant stone debris.  A final retrograde pyelogram showed mild renal pelvic fullness and outline the collecting system to ensure that each and every calyx have been directly visualized and cleared.  There  is no significant stone burden residual although there was a lot of debris.  I backed the scope down the length of the ureter inspecting along the way.  There is no ureteral injuries or trauma appreciated, some mild debris was noted however.  I elected to place a stent not on a tether to leave for at least a week given the amount of  debris.  The wire was backloaded over rigid cystoscope.  A 6 x 26 French double-J ureteral stent was advanced over the wire up to the level of the left renal pelvis.  It was partially drawn till the coil in the duct in the upper pole calyx.  The wire was fully withdrawn and a full coil was noted within the bladder.  The bladder was then drained.  The patient was then cleaned and dried, repositioned in supine position, reversed of anesthesia, and taken to the PACU in stable condition.  Plan: Plan for cystoscopy/stent removal next week.  Vanna Scotland, M.D.

## 2020-08-16 NOTE — Transfer of Care (Signed)
Immediate Anesthesia Transfer of Care Note  Patient: Jonathan Gould  Procedure(s) Performed: CYSTOSCOPY/URETEROSCOPY/HOLMIUM LASER/STENT EXCHANGE (Left Ureter)  Patient Location: PACU  Anesthesia Type:General  Level of Consciousness: awake, alert  and oriented  Airway & Oxygen Therapy: Patient Spontanous Breathing  Post-op Assessment: Report given to RN and Post -op Vital signs reviewed and stable  Post vital signs: Reviewed and stable  Last Vitals:  Vitals Value Taken Time  BP 127/67 08/16/20 1232  Temp    Pulse 87 08/16/20 1236  Resp 15 08/16/20 1236  SpO2 97 % 08/16/20 1236  Vitals shown include unvalidated device data.  Last Pain:  Vitals:   08/16/20 0831  TempSrc:   PainSc: 0-No pain         Complications: No complications documented.

## 2020-08-16 NOTE — Discharge Instructions (Signed)
You have a ureteral stent in place.  This is a tube that extends from your kidney to your bladder.  This may cause urinary bleeding, burning with urination, and urinary frequency.  Please call our office or present to the ED if you develop fevers >101 or pain which is not able to be controlled with oral pain medications.  You may be given either Flomax and/ or ditropan to help with bladder spasms and stent pain in addition to pain medications.    [facility] [address], [address] [place], [place] [address] [phone] 

## 2020-08-16 NOTE — Anesthesia Procedure Notes (Signed)
Procedure Name: Intubation Date/Time: 08/16/2020 10:39 AM Performed by: Reece Agar, CRNA Pre-anesthesia Checklist: Patient identified, Emergency Drugs available, Suction available and Patient being monitored Patient Re-evaluated:Patient Re-evaluated prior to induction Oxygen Delivery Method: Circle system utilized Preoxygenation: Pre-oxygenation with 100% oxygen Induction Type: IV induction Ventilation: Mask ventilation without difficulty Laryngoscope Size: McGraph and 4 Grade View: Grade I Tube type: Oral Tube size: 7.5 mm Number of attempts: 1 Airway Equipment and Method: Stylet and Oral airway Placement Confirmation: ETT inserted through vocal cords under direct vision,  positive ETCO2 and breath sounds checked- equal and bilateral Secured at: 25 cm Tube secured with: Tape Dental Injury: Teeth and Oropharynx as per pre-operative assessment

## 2020-08-17 ENCOUNTER — Encounter: Payer: Self-pay | Admitting: Urology

## 2020-08-17 NOTE — Anesthesia Postprocedure Evaluation (Signed)
Anesthesia Post Note  Patient: Jonathan Gould  Procedure(s) Performed: CYSTOSCOPY/URETEROSCOPY/HOLMIUM LASER/STENT EXCHANGE (Left Ureter)  Patient location during evaluation: PACU Anesthesia Type: General Level of consciousness: awake and alert Pain management: pain level controlled Vital Signs Assessment: post-procedure vital signs reviewed and stable Respiratory status: spontaneous breathing, nonlabored ventilation, respiratory function stable and patient connected to nasal cannula oxygen Cardiovascular status: blood pressure returned to baseline and stable Postop Assessment: no apparent nausea or vomiting Anesthetic complications: no   No complications documented.   Last Vitals:  Vitals:   08/16/20 1320 08/16/20 1323  BP:  (!) (P) 141/66  Pulse:  (P) 80  Resp: 20 (P) 20  Temp: 36.6 C   SpO2:  (P) 99%    Last Pain:  Vitals:   08/17/20 0921  TempSrc:   PainSc: 0-No pain                 Corinda Gubler

## 2020-08-20 LAB — CALCULI, WITH PHOTOGRAPH (CLINICAL LAB)
Calcium Oxalate Monohydrate: 60 %
Hydroxyapatite: 40 %
Weight Calculi: 6 mg

## 2020-08-24 ENCOUNTER — Encounter: Payer: Self-pay | Admitting: Urology

## 2020-08-24 ENCOUNTER — Other Ambulatory Visit: Payer: Self-pay

## 2020-08-24 ENCOUNTER — Other Ambulatory Visit: Payer: Self-pay | Admitting: *Deleted

## 2020-08-24 ENCOUNTER — Ambulatory Visit (INDEPENDENT_AMBULATORY_CARE_PROVIDER_SITE_OTHER): Payer: BC Managed Care – PPO | Admitting: Urology

## 2020-08-24 VITALS — BP 146/79 | HR 96

## 2020-08-24 DIAGNOSIS — N200 Calculus of kidney: Secondary | ICD-10-CM | POA: Diagnosis not present

## 2020-08-24 MED ORDER — SULFAMETHOXAZOLE-TRIMETHOPRIM 800-160 MG PO TABS
1.0000 | ORAL_TABLET | Freq: Once | ORAL | Status: AC
  Administered 2020-08-24: 1 via ORAL

## 2020-08-24 NOTE — Addendum Note (Signed)
Addended by: Milas Kocher A on: 08/24/2020 02:25 PM   Modules accepted: Orders

## 2020-08-24 NOTE — Addendum Note (Signed)
Addended by: Milas Kocher A on: 08/24/2020 02:30 PM   Modules accepted: Orders

## 2020-08-24 NOTE — Progress Notes (Signed)
   08/24/20  CC:  Chief Complaint  Patient presents with  . Cysto Stent Removal    HPI: 65 year old male status post ureteroscopy for a large left renal pelvic stone who presents today for cystoscopy, stent removal.  No postoperative issues.  Blood pressure (!) 146/79, pulse 96. NED. A&Ox3.   No respiratory distress   Abd soft, NT, ND Normal phallus with bilateral descended testicles  Cystoscopy/ Stent removal procedure  Patient identification was confirmed, informed consent was obtained, and patient was prepped using Betadine solution.  Lidocaine jelly was administered per urethral meatus.    Preoperative abx where received prior to procedure.    Procedure: - Flexible cystoscope introduced, without any difficulty.   - Thorough search of the bladder revealed:    normal urethral meatus  Stent seen emanating from left ureteral orifice, grasped with stent graspers, and removed in entirety.    Post-Procedure: - Patient tolerated the procedure well   Assessment/ Plan:  1. Left renal stone Status post uncomplicated stent removal today  Warning symptoms reviewed  Follow-up in 4 weeks with renal ultrasound prior - Urinalysis, Complete   Vanna Scotland, MD

## 2020-08-25 LAB — URINALYSIS, COMPLETE
Bilirubin, UA: NEGATIVE
Glucose, UA: NEGATIVE
Ketones, UA: NEGATIVE
Nitrite, UA: NEGATIVE
Specific Gravity, UA: 1.01 (ref 1.005–1.030)
Urobilinogen, Ur: 0.2 mg/dL (ref 0.2–1.0)
pH, UA: 6 (ref 5.0–7.5)

## 2020-08-25 LAB — MICROSCOPIC EXAMINATION
Bacteria, UA: NONE SEEN
RBC, Urine: >30 /hpf — AB (ref 0–2)

## 2020-08-27 ENCOUNTER — Other Ambulatory Visit: Payer: BC Managed Care – PPO

## 2020-09-16 ENCOUNTER — Ambulatory Visit: Payer: Self-pay | Admitting: Urology

## 2020-09-17 ENCOUNTER — Ambulatory Visit
Admission: RE | Admit: 2020-09-17 | Discharge: 2020-09-17 | Disposition: A | Discharge status: Home / Self Care | Payer: BC Managed Care – PPO | Source: Ambulatory Visit | Attending: Urology | Admitting: Urology

## 2020-09-17 DIAGNOSIS — N200 Calculus of kidney: Secondary | ICD-10-CM | POA: Diagnosis not present

## 2020-09-21 ENCOUNTER — Ambulatory Visit: Payer: BC Managed Care – PPO | Admitting: Urology

## 2020-09-21 ENCOUNTER — Encounter: Payer: Self-pay | Admitting: Urology

## 2020-09-21 ENCOUNTER — Other Ambulatory Visit: Payer: Self-pay

## 2020-09-21 VITALS — BP 167/69 | HR 79 | Ht 75.0 in | Wt 213.0 lb

## 2020-09-21 DIAGNOSIS — N200 Calculus of kidney: Secondary | ICD-10-CM

## 2020-09-21 NOTE — Progress Notes (Signed)
09/21/2020 2:32 PM   Jonathan Gould April 18, 1955 [id number]  Referring provider: No referring provider defined for this encounter.  Chief Complaint  Patient presents with  . Nephrolithiasis    HPI: 65 year old male with a personal history of nephrolithiasis who returns today for follow-up of left ureteroscopy.  Initially presented with 1.6 cm large left proximal ureteral calculus.  He also had 2 additional stones.   In the setting of urinary tract infection with obstructing stone, he underwent urgent ureteral stent placement and ultimately was discharged on p.o. antibiotics.   He underwent uncomplicated left ureteroscopy with laser lithotripsy for definitive stone management thereafter.  Returned to the office for stent removal week.  Follow-up renal ultrasound on 09/17/2020 that shows no residual hydronephrosis which stone burden.  He has remote history of kidney stones with similar presentation to the above.  Stone specimen consistent with 60% calcium oxalate monohydrate, 40% hydro-Apatate.  He reports that he is doing well.  He said no further flank pain.  He did pass a few small stone/stone debris a few days following the stent removal but has not passed any since.  Since being retired just a few weeks ago, he has been better about drinking water.  When he was working, he had difficulty doing this.  He also had a lot of stress.  He mentions today that in a previous note that he reviewed on my chart, it indicated that he was a recreational drug user.  He has never used drugs.  He would like this noted and corrected in the chart.  This was addended.   PMH: Past Medical History:  Diagnosis Date  . Dental crowns present 07/2020   MULTIPLE LOWER TEMPORARY DENTAL CROWNS  . Kidney stones   . Kidney stones     Surgical History: Past Surgical History:  Procedure Laterality Date  . CYSTOSCOPY WITH RETROGRADE PYELOGRAM, URETEROSCOPY AND STENT PLACEMENT Left 07/24/2020   Procedure:  CYSTOSCOPY WITH RETROGRADE PYELOGRAM, URETEROSCOPY AND STENT PLACEMENT;  Surgeon: Jannifer Hick, MD;  Location: [facility];  Service: Urology;  Laterality: Left;  . CYSTOSCOPY/URETEROSCOPY/HOLMIUM LASER/STENT PLACEMENT Left 08/16/2020   Procedure: CYSTOSCOPY/URETEROSCOPY/HOLMIUM LASER/STENT EXCHANGE;  Surgeon: Vanna Scotland, MD;  Location: [facility];  Service: Urology;  Laterality: Left;    Home Medications:  Allergies as of 09/21/2020   No Known Allergies     Medication List       Accurate as of September 21, 2020  2:32 PM. If you have any questions, ask your nurse or doctor.        STOP taking these medications   acetaminophen 500 MG tablet Commonly known as: TYLENOL Stopped by: Vanna Scotland, MD       Allergies: No Known Allergies  Family History: No family history on file.  Social History:  reports that he has never smoked. His smokeless tobacco use includes chew. He reports current alcohol use. He reports that he does not use drugs.   Physical Exam: BP (!) 167/69   Pulse 79   Ht 6\' 3"  (1.905 m)   Wt 213 lb (96.6 kg)   BMI 26.62 kg/m   Constitutional:  Alert and oriented, No acute distress.  Accompanied by wife today. HEENT: [place] AT, moist mucus membranes.  Trachea midline, no masses. Cardiovascular: No clubbing, cyanosis, or edema. Respiratory: Normal respiratory effort, no increased work of breathing. Skin: No rashes, bruises or suspicious lesions. Neurologic: Grossly intact, no focal deficits, moving all 4 extremities. Psychiatric: Normal mood and affect.  Laboratory Data:  Lab Results  Component Value Date   WBC 10.6 (H) 07/26/2020   HGB 11.6 (L) 07/26/2020   HCT 34.6 (L) 07/26/2020   MCV 93.3 07/26/2020   PLT 151 07/26/2020    Lab Results  Component Value Date   CREATININE 1.43 (H) 07/26/2020    Pertinent Imaging: Ultrasound renal complete  Narrative CLINICAL DATA:  Left renal calculi, recent ureteroscopy with laser lithotripsy on  08/16/2020  EXAM: RENAL / URINARY TRACT ULTRASOUND COMPLETE  COMPARISON:  CT scan 07/24/2020  FINDINGS: Right Kidney:  Renal measurements: 10.5 by 4.8 by 6.2 cm = volume: 163 mL. Echogenicity within normal limits. No mass or hydronephrosis visualized.  Left Kidney:  Renal measurements: 11.5 by 6.0 by 5.1 cm = volume: 186 mL. Echogenicity within normal limits. No mass or hydronephrosis visualized.  Bladder:  Borderline wall thickening although much of this might be ascribed to nondistention.  Other:  None.  IMPRESSION: 1. No significant renal calculi are detected sonographic coli on today's exam. 2. Borderline wall thickening of the urinary bladder, which can be seen in cystitis, although much of the appearance may be due to nondistention.   Electronically Signed By: Gaylyn Rong M.D. On: 09/17/2020 10:31  Renal ultrasound images were personally reviewed today.  Agree with radiologic interpretation.  No stone debris or hydro-.  Assessment & Plan:    1. Left renal stone Status post definitive ureteroscopy  Follow-up imaging indicate that he is now stone free without any residual hydroureteronephrosis which is reassuring  Stone analysis reviewed with patient  We discussed general stone prevention techniques including drinking plenty water with goal of producing 2.5 L urine daily, increased citric acid intake, avoidance of high oxalate containing foods, and decreased salt intake.  Information about dietary recommendations given today.+  We will hold off on 24-hour urine metabolic evaluation for the time being given his fairly infrequent stone episodes however consider for the future if his frequency of stone attacks increases  Follow-up in a year with a KUB or sooner as needed if he has any signs or symptoms of recurrent stone disease - Abdomen 1 view (KUB); Future   Vanna Scotland, MD  [facility] [address] [place], [place] [address] [phone]

## 2020-10-20 ENCOUNTER — Ambulatory Visit: Payer: Self-pay | Admitting: Urology

## 2021-07-31 IMAGING — CT CT RENAL STONE PROTOCOL
2 of 4 series · 16 of 46 positions shown, 18 images · non-contrast
Comparison: Abdominal x-ray from same day. CT abdomen pelvis dated
January 09, 2011.

CLINICAL DATA: Left-sided flank pain and fever.

EXAM:
CT ABDOMEN AND PELVIS WITHOUT CONTRAST
TECHNIQUE: Multidetector CT imaging of the abdomen and pelvis was performed
following the standard protocol without IV contrast.

[Series 2: stone full standard · axial · 0.76mm/px · z∈[-1030,-555]mm · 13 of 103 slices shown, 15 images]
[im 4/103  soft-tissue]
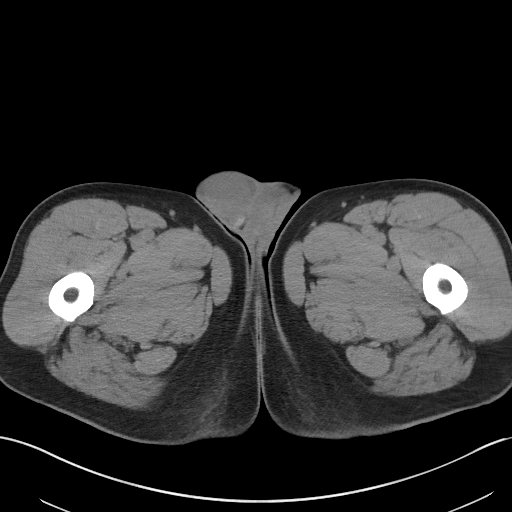
[im 4/103  bone]
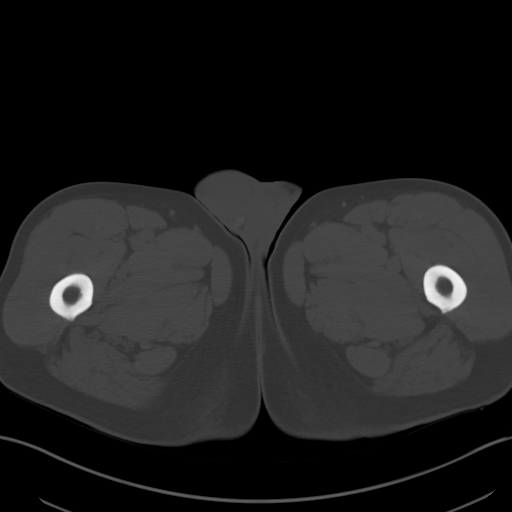
[im 12/103  soft-tissue]
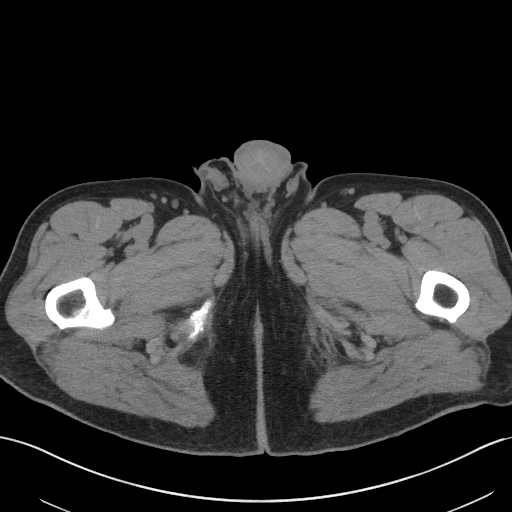
[im 20/103  soft-tissue]
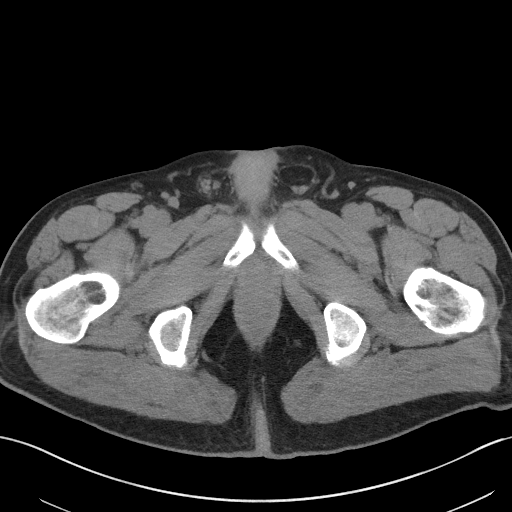
[im 28/103  soft-tissue]
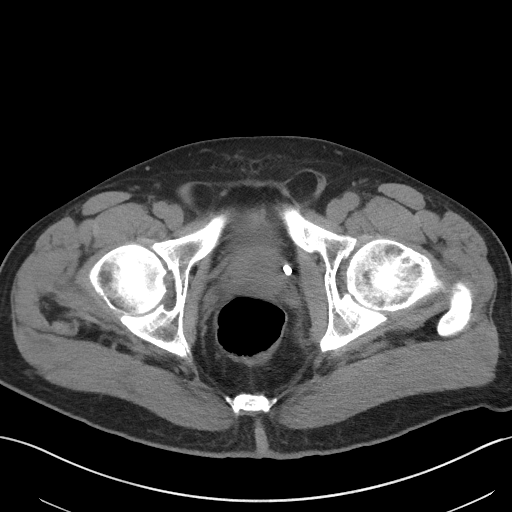
[im 36/103  soft-tissue]
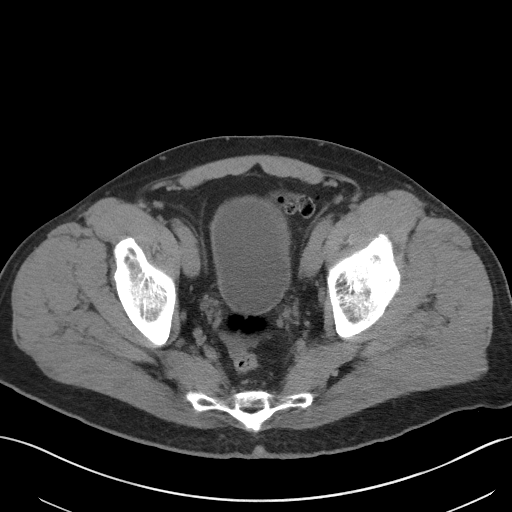
[im 44/103  soft-tissue]
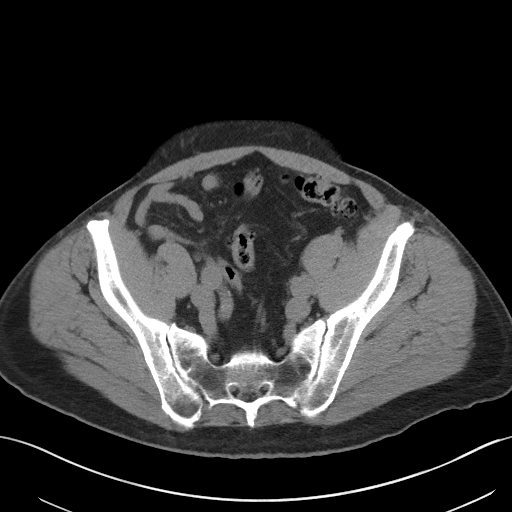
[im 52/103  soft-tissue]
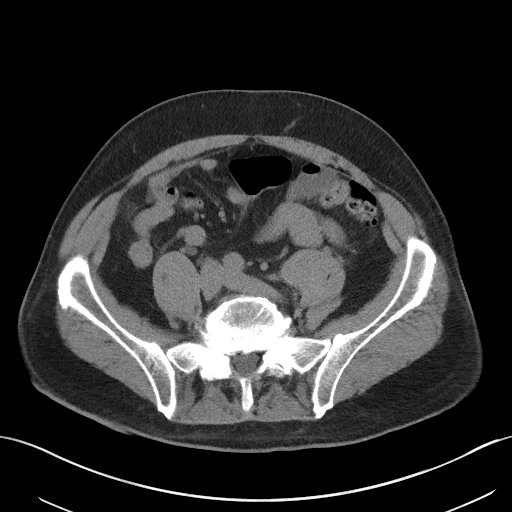
[im 59/103  soft-tissue]
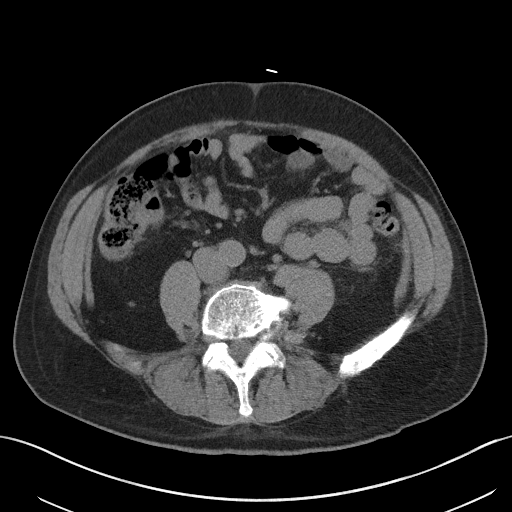
[im 67/103  soft-tissue]
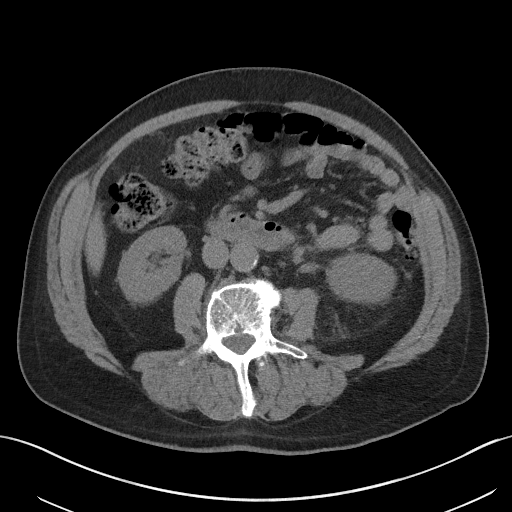
[im 67/103  bone]
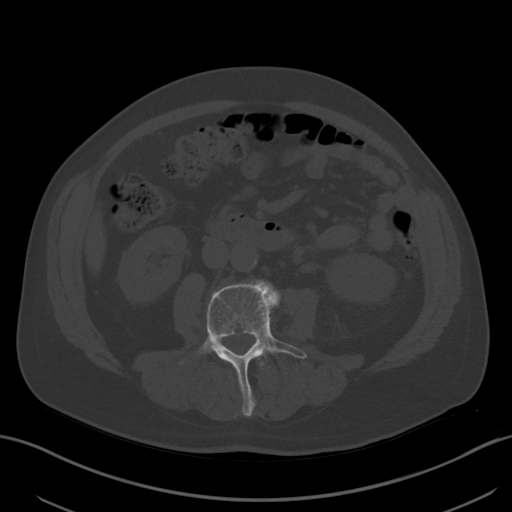
[im 75/103  soft-tissue]
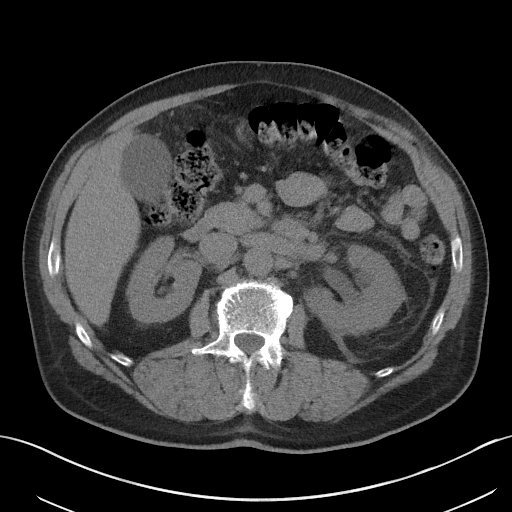
[im 83/103  soft-tissue]
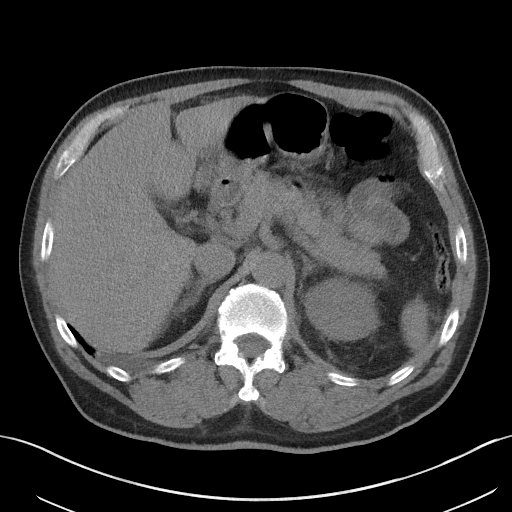
[im 91/103  soft-tissue]
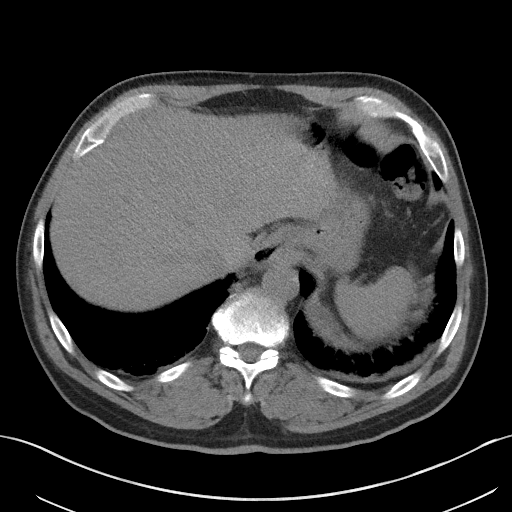
[im 99/103  soft-tissue]
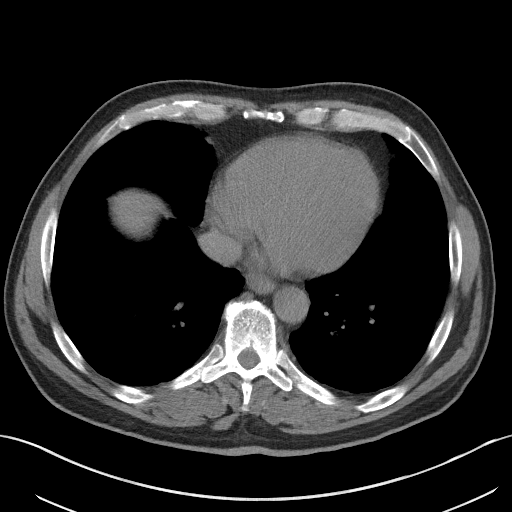

[Series 5: coronal · coronal · 0.76mm/px · 3 of 141 slices shown]
[im 47/141  soft-tissue]
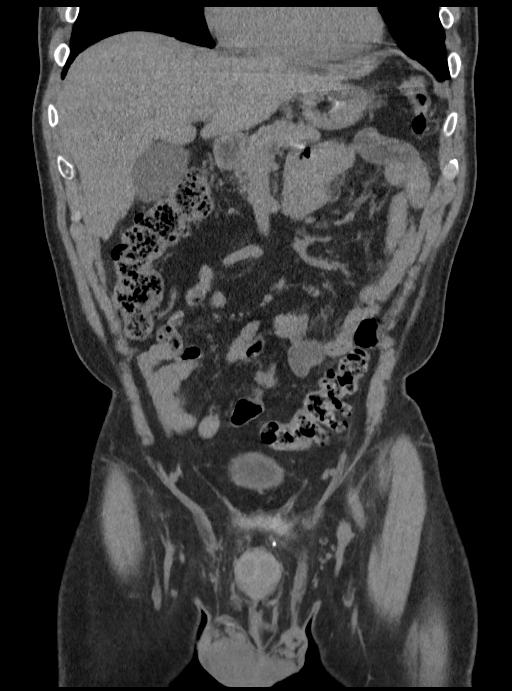
[im 63/141  soft-tissue]
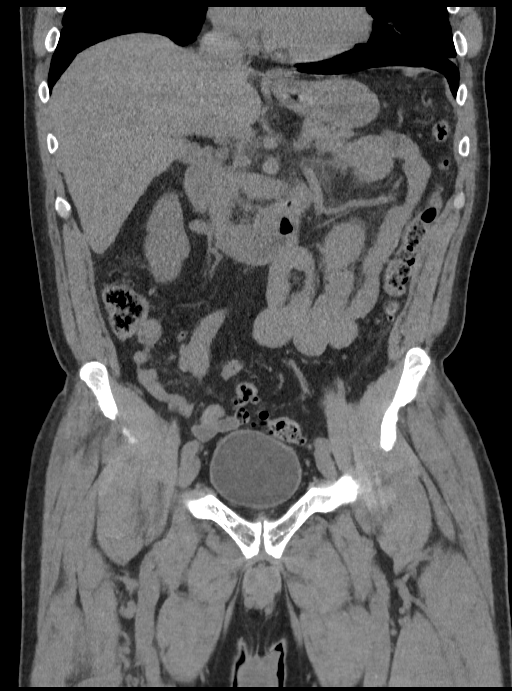
[im 78/141  soft-tissue]
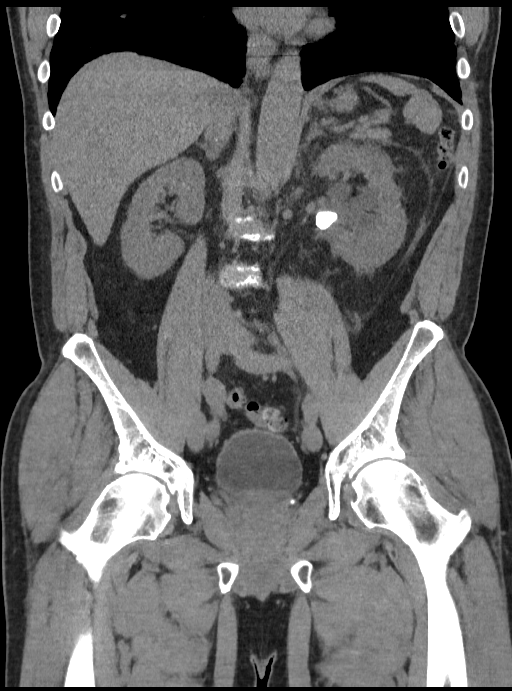

[16 of 46 positions shown; findings below may reference images not displayed]

FINDINGS: Lower chest: Mild bibasilar atelectasis/scarring.

Hepatobiliary: No focal liver abnormality is seen. No gallstones,
gallbladder wall thickening, or biliary dilatation.

Pancreas: Unremarkable. No pancreatic ductal dilatation or
surrounding inflammatory changes.

Spleen: Normal in size without focal abnormality.

Adrenals/Urinary Tract: Adrenal glands are unremarkable. Large
cm calculus at the left UPJ with resultant mild left hydronephrosis.
There are two additional punctate calculi in the lower pole of the
left kidney. Punctate calculus in the upper pole of the right
kidney. Left perinephric fat stranding. The bladder is unremarkable.

Stomach/Bowel: Small hiatal hernia. The stomach is otherwise within
normal limits. No bowel wall thickening, distention, or surrounding
inflammatory changes. Mild to moderate left-sided colonic
diverticulosis. Normal appendix.

Vascular/Lymphatic: Aortic atherosclerosis. No enlarged abdominal or
pelvic lymph nodes.

Reproductive: Prostate is unremarkable.

Other: Trace free fluid in the pelvis. Slightly increased small fat
containing left inguinal hernia. No pneumoperitoneum.

Musculoskeletal: No acute or significant osseous findings.
IMPRESSION: 1. Large 1.6 cm calculus at the left UPJ with resultant mild left
hydronephrosis.
2. Additional punctate bilateral nephrolithiasis.
3. Aortic Atherosclerosis ([id number]-FGH.H).

## 2021-09-21 ENCOUNTER — Ambulatory Visit: Payer: Self-pay | Admitting: Urology

## 2021-09-24 IMAGING — US US RENAL
1 series · 15 of 25 positions shown · non-contrast
Comparison: CT scan 07/24/2020

CLINICAL DATA: Left renal calculi, recent ureteroscopy with laser
lithotripsy on 08/16/2020

EXAM:
RENAL / URINARY TRACT ULTRASOUND COMPLETE

[Series 1: us renal · 15 of 36 slices shown]
[im 1/36]
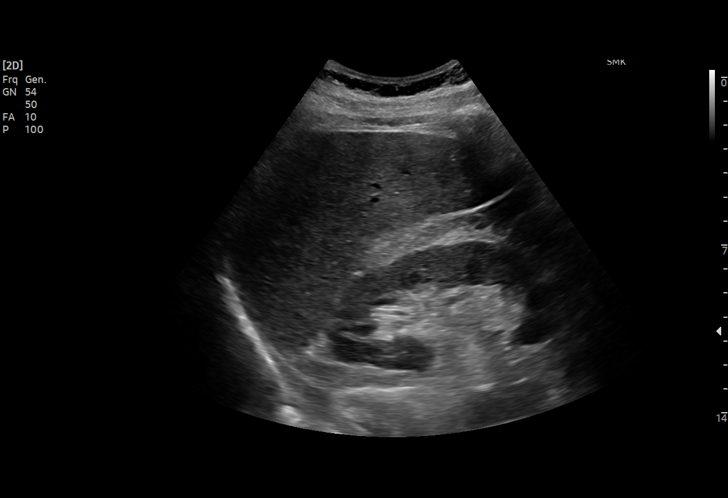
[im 3/36]
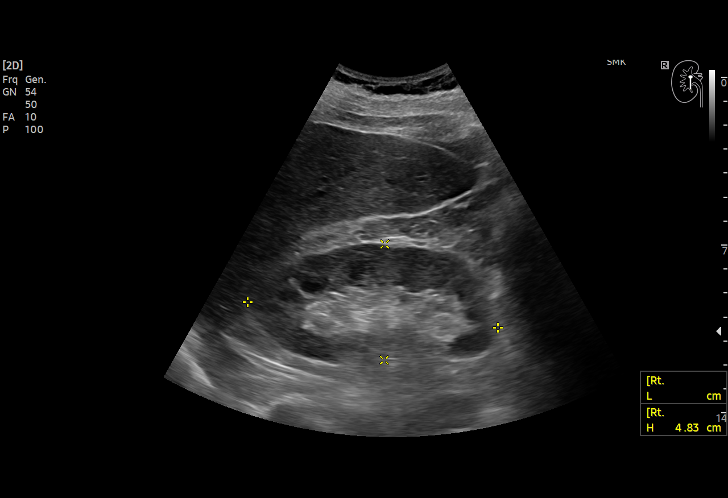
[im 6/36]
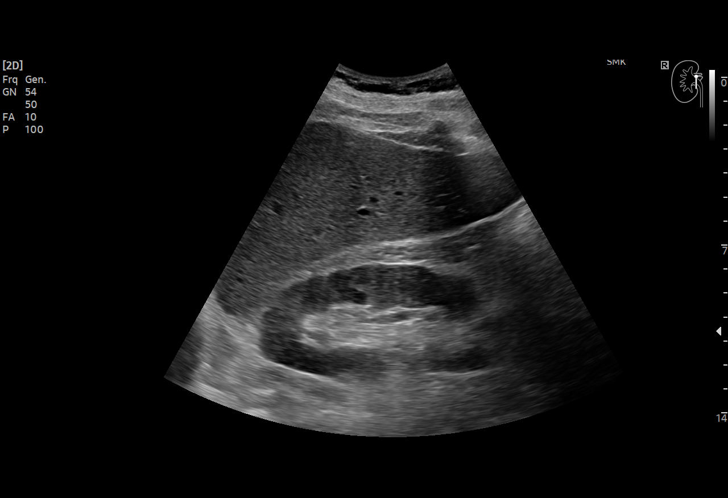
[im 8/36]
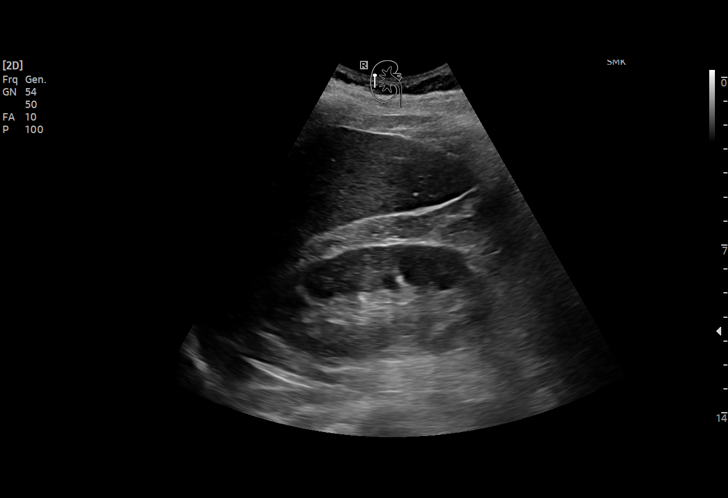
[im 11/36]
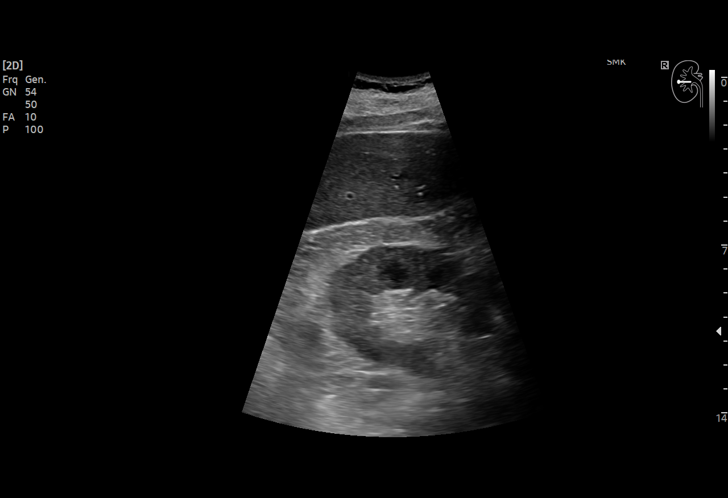
[im 14/36]
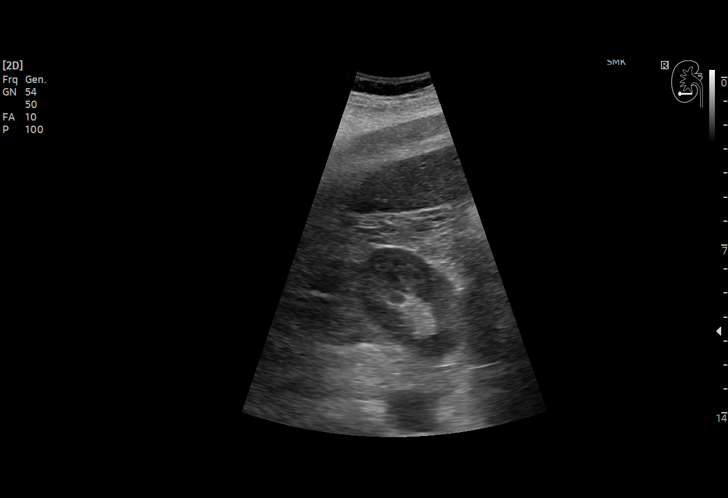
[im 15/36]
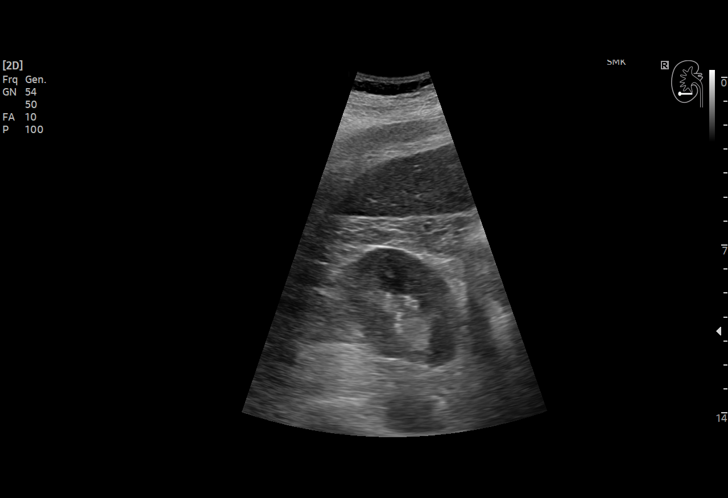
[im 18/36]
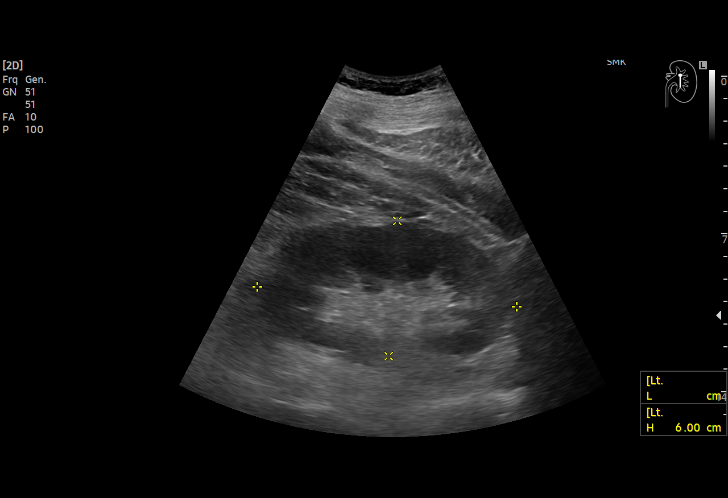
[im 21/36]
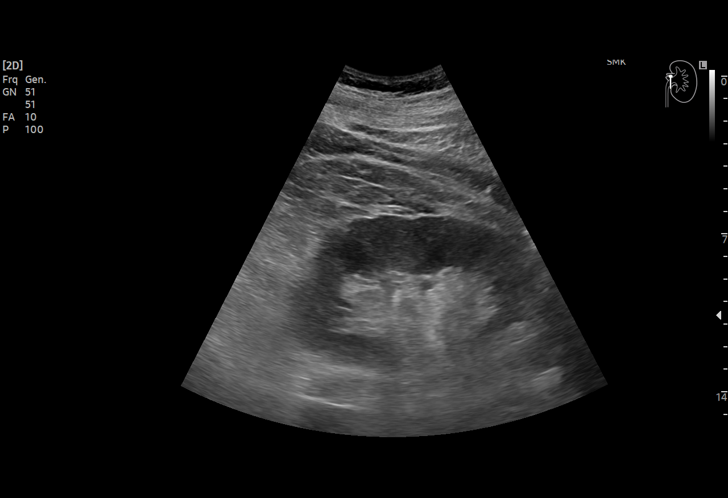
[im 22/36]
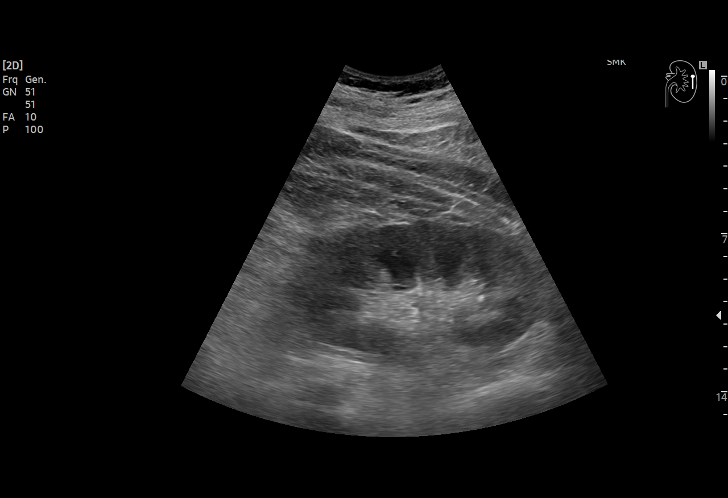
[im 25/36]
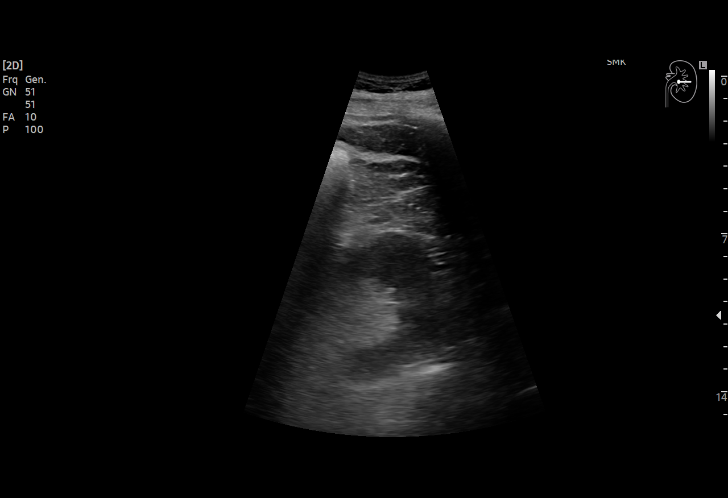
[im 28/36]
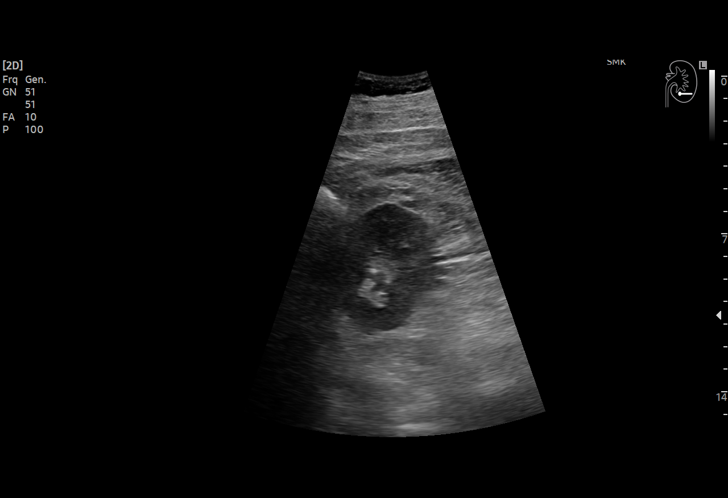
[im 30/36]
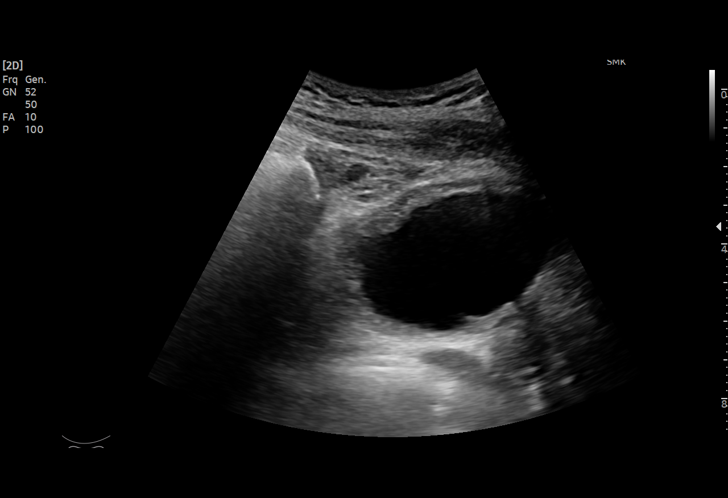
[im 33/36]
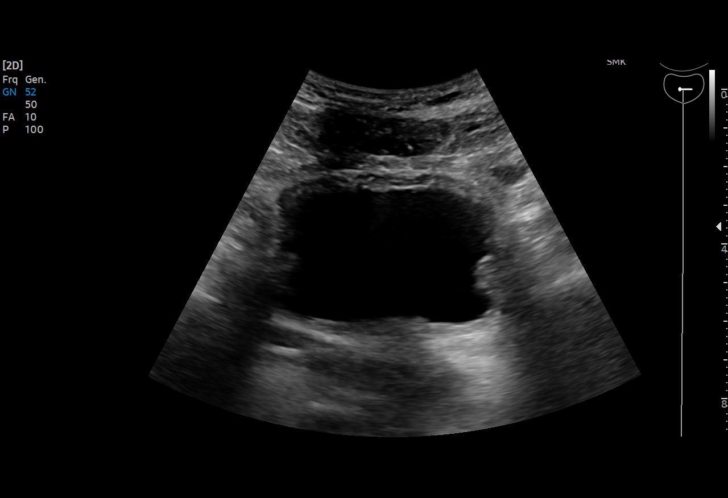
[im 36/36]
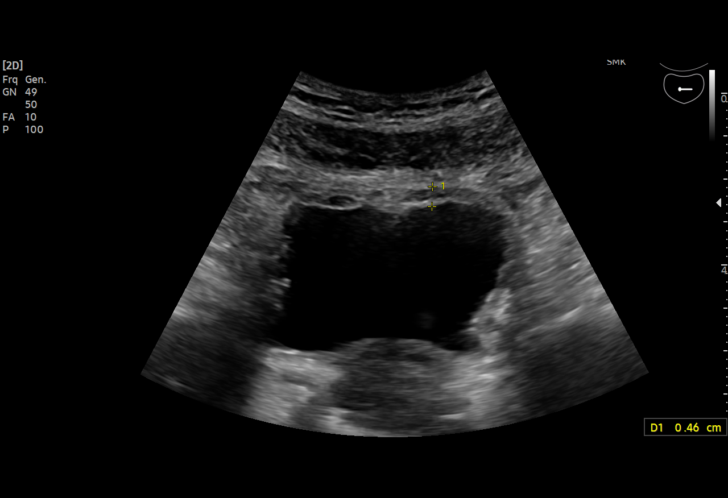

[15 of 25 positions shown; findings below may reference images not displayed]

FINDINGS: Right Kidney:

Renal measurements: 10.5 by 4.8 by 6.2 cm = volume: 163 mL.
Echogenicity within normal limits. No mass or hydronephrosis
visualized.

Left Kidney:

Renal measurements: 11.5 by 6.0 by 5.1 cm = volume: 186 mL.
Echogenicity within normal limits. No mass or hydronephrosis
visualized.

Bladder:

Borderline wall thickening although much of this might be ascribed
to nondistention.

Other:

None.
IMPRESSION: 1. No significant renal calculi are detected sonographic coli on
today's exam.
2. Borderline wall thickening of the urinary bladder, which can be
seen in cystitis, although much of the appearance may be due to
nondistention.

## 2022-01-23 ENCOUNTER — Other Ambulatory Visit: Payer: Self-pay

## 2022-01-23 DIAGNOSIS — N200 Calculus of kidney: Secondary | ICD-10-CM

## 2022-01-23 DIAGNOSIS — N10 Acute tubulo-interstitial nephritis: Secondary | ICD-10-CM

## 2022-01-24 ENCOUNTER — Ambulatory Visit
Admission: RE | Admit: 2022-01-24 | Discharge: 2022-01-24 | Disposition: A | Discharge status: Home / Self Care | Payer: PPO | Attending: Urology | Admitting: Urology

## 2022-01-24 ENCOUNTER — Ambulatory Visit: Payer: PPO | Admitting: Urology

## 2022-01-24 ENCOUNTER — Encounter: Payer: Self-pay | Admitting: Urology

## 2022-01-24 ENCOUNTER — Ambulatory Visit
Admission: RE | Admit: 2022-01-24 | Discharge: 2022-01-24 | Disposition: A | Discharge status: Home / Self Care | Payer: PPO | Source: Ambulatory Visit | Attending: Urology | Admitting: Urology

## 2022-01-24 VITALS — BP 147/72 | HR 75 | Ht 75.0 in | Wt 200.0 lb

## 2022-01-24 DIAGNOSIS — N200 Calculus of kidney: Secondary | ICD-10-CM | POA: Insufficient documentation

## 2022-01-24 DIAGNOSIS — N10 Acute tubulo-interstitial nephritis: Secondary | ICD-10-CM | POA: Insufficient documentation

## 2022-01-24 DIAGNOSIS — Z87442 Personal history of urinary calculi: Secondary | ICD-10-CM | POA: Diagnosis not present

## 2022-01-24 NOTE — Progress Notes (Signed)
   01/24/2022 1:23 PM   Jonathan Gould 02/14/1956 [id number]  Referring provider: No referring provider defined for this encounter.  Chief Complaint  Patient presents with   Nephrolithiasis    HPI: 66 year old male with personal history of nephrolithiasis who returns today at 12+ months with KUB.  He initially presented with a very large 1.6 cm left proximal ureteral calculus along with 2 other smaller nonobstructing stones.  He underwent uncomplicated ureteroscopy with follow-up renal ultrasound still showing no residual stone burden.  KUB today was personally reviewed.  Awaiting radiologic interpretation.  There is no obvious bilateral intrarenal stones.  There are 2 left-sided pelvic phleboliths in comparison to his previous CT, are unchanged.  No pelvic stones appreciated.  He reports that over the last year, has had no issues.  No flank pain.  He drinks lemon water on a regular basis.  Some days, he forgets to drink water when he is getting busy.  No urinary issues.   PMH: Past Medical History:  Diagnosis Date   Dental crowns present 07/2020   MULTIPLE LOWER TEMPORARY DENTAL CROWNS   Kidney stones    Kidney stones     Surgical History: Past Surgical History:  Procedure Laterality Date   CYSTOSCOPY WITH RETROGRADE PYELOGRAM, URETEROSCOPY AND STENT PLACEMENT Left 07/24/2020   Procedure: CYSTOSCOPY WITH RETROGRADE PYELOGRAM, URETEROSCOPY AND STENT PLACEMENT;  Surgeon: Janith Lima, MD;  Location: [facility];  Service: Urology;  Laterality: Left;   CYSTOSCOPY/URETEROSCOPY/HOLMIUM LASER/STENT PLACEMENT Left 08/16/2020   Procedure: CYSTOSCOPY/URETEROSCOPY/HOLMIUM LASER/STENT EXCHANGE;  Surgeon: Hollice Espy, MD;  Location: [facility];  Service: Urology;  Laterality: Left;    Home Medications:  Allergies as of 01/24/2022   No Known Allergies      Medication List        Accurate as of January 24, 2022  1:23 PM. If you have any questions, ask your nurse or doctor.           PROBIOTIC DAILY PO Take by mouth.        Allergies: No Known Allergies  Family History: No family history on file.  Social History:  reports that he has never smoked. His smokeless tobacco use includes chew. He reports current alcohol use. He reports that he does not use drugs.   Physical Exam: BP (!) 147/72   Pulse 75   Ht 6\' 3"  (1.905 m)   Wt 200 lb (90.7 kg)   BMI 25.00 kg/m   Constitutional:  Alert and oriented, No acute distress.  Accompanied by his wife today. HEENT: [place] AT, moist mucus membranes.  Trachea midline, no masses. Cardiovascular: No clubbing, cyanosis, or edema. Neurologic: Grossly intact, no focal deficits, moving all 4 extremities. Psychiatric: Normal mood and affect.  Laboratory Data: Lab Results  Component Value Date   WBC 10.6 (H) 07/26/2020   HGB 11.6 (L) 07/26/2020   HCT 34.6 (L) 07/26/2020   MCV 93.3 07/26/2020   PLT 151 07/26/2020    Lab Results  Component Value Date   CREATININE 1.43 (H) 07/26/2020    Assessment & Plan:    1. History of kidney stones History of acute stone events over a year ago requiring ureteroscopic intervention  KUB today shows no obvious stone burden which is reassuring  We reviewed stone dietary recommendations.  At this point, he can follow-up as needed.  Hollice Espy, MD  [facility] [address], [place], [place] [address] [phone]
